# Patient Record
Sex: Male | Born: 1970 | Race: White | Hispanic: No | Marital: Single | Smoking: Current every day smoker
Health system: Southern US, Community
[De-identification: ages and names within clinical notes are randomized; demographics above are authoritative.]

## PROBLEM LIST (undated history)

## (undated) DIAGNOSIS — T7840XA Allergy, unspecified, initial encounter: Secondary | ICD-10-CM

## (undated) DIAGNOSIS — F419 Anxiety disorder, unspecified: Secondary | ICD-10-CM

## (undated) DIAGNOSIS — R011 Cardiac murmur, unspecified: Secondary | ICD-10-CM

## (undated) DIAGNOSIS — Y249XXA Unspecified firearm discharge, undetermined intent, initial encounter: Secondary | ICD-10-CM

## (undated) DIAGNOSIS — B019 Varicella without complication: Secondary | ICD-10-CM

## (undated) DIAGNOSIS — W3400XA Accidental discharge from unspecified firearms or gun, initial encounter: Secondary | ICD-10-CM

## (undated) DIAGNOSIS — F319 Bipolar disorder, unspecified: Secondary | ICD-10-CM

## (undated) DIAGNOSIS — F1910 Other psychoactive substance abuse, uncomplicated: Secondary | ICD-10-CM

## (undated) HISTORY — PX: OTHER SURGICAL HISTORY: SHX169

## (undated) HISTORY — PX: MYRINGOTOMY: SUR874

## (undated) HISTORY — DX: Cardiac murmur, unspecified: R01.1

## (undated) HISTORY — PX: LUNG SURGERY: SHX703

## (undated) HISTORY — PX: GASTROSTOMY W/ FEEDING TUBE: SUR642

## (undated) HISTORY — DX: Anxiety disorder, unspecified: F41.9

## (undated) HISTORY — DX: Allergy, unspecified, initial encounter: T78.40XA

## (undated) HISTORY — DX: Other psychoactive substance abuse, uncomplicated: F19.10

## (undated) HISTORY — PX: FACIAL RECONSTRUCTION SURGERY: SHX631

## (undated) HISTORY — PX: TONSILLECTOMY: SUR1361

---

## 2008-07-06 ENCOUNTER — Emergency Department (HOSPITAL_COMMUNITY): Admission: EM | Admit: 2008-07-06 | Discharge: 2008-07-06 | Payer: Self-pay | Admitting: Emergency Medicine

## 2008-07-28 ENCOUNTER — Emergency Department (HOSPITAL_COMMUNITY): Admission: EM | Admit: 2008-07-28 | Discharge: 2008-07-31 | Payer: Self-pay | Admitting: Emergency Medicine

## 2008-08-07 ENCOUNTER — Emergency Department (HOSPITAL_COMMUNITY): Admission: EM | Admit: 2008-08-07 | Discharge: 2008-08-08 | Payer: Self-pay | Admitting: Emergency Medicine

## 2008-08-19 ENCOUNTER — Emergency Department (HOSPITAL_COMMUNITY): Admission: EM | Admit: 2008-08-19 | Discharge: 2008-08-19 | Payer: Self-pay | Admitting: Emergency Medicine

## 2008-08-23 ENCOUNTER — Emergency Department (HOSPITAL_COMMUNITY): Admission: EM | Admit: 2008-08-23 | Discharge: 2008-08-23 | Payer: Self-pay | Admitting: Emergency Medicine

## 2008-09-03 ENCOUNTER — Emergency Department (HOSPITAL_COMMUNITY): Admission: EM | Admit: 2008-09-03 | Discharge: 2008-09-04 | Payer: Self-pay | Admitting: Emergency Medicine

## 2011-09-05 LAB — BASIC METABOLIC PANEL
Calcium: 9.3
GFR calc Af Amer: >60
GFR calc non Af Amer: >60
Glucose, Bld: 114 — ABNORMAL HIGH
Sodium: 136

## 2011-09-05 LAB — CBC
Hemoglobin: 14.7
RBC: 4.85
RDW: 12.8
WBC: 16 — ABNORMAL HIGH

## 2011-09-05 LAB — DIFFERENTIAL
Basophils Absolute: 0
Lymphocytes Relative: 17
Lymphs Abs: 2.7
Monocytes Absolute: 1
Neutro Abs: 12.1 — ABNORMAL HIGH

## 2011-09-05 LAB — ETHANOL: Alcohol, Ethyl (B): <5

## 2011-09-05 LAB — RAPID URINE DRUG SCREEN, HOSP PERFORMED
Benzodiazepines: NOT DETECTED
Cocaine: NOT DETECTED

## 2011-09-08 LAB — DIFFERENTIAL
Lymphocytes Relative: 33
Lymphs Abs: 3.2
Monocytes Absolute: 0.8
Monocytes Relative: 8
Neutro Abs: 5.5

## 2011-09-08 LAB — BASIC METABOLIC PANEL
CO2: 30
Calcium: 8.5
GFR calc Af Amer: >60
GFR calc non Af Amer: >60
Sodium: 139

## 2011-09-08 LAB — RAPID URINE DRUG SCREEN, HOSP PERFORMED
Amphetamines: NOT DETECTED
Barbiturates: NOT DETECTED
Tetrahydrocannabinol: POSITIVE — AB

## 2011-09-08 LAB — CBC
Hemoglobin: 12.7 — ABNORMAL LOW
RBC: 4.11 — ABNORMAL LOW

## 2011-09-10 LAB — RAPID URINE DRUG SCREEN, HOSP PERFORMED
Amphetamines: NOT DETECTED
Barbiturates: NOT DETECTED
Benzodiazepines: NOT DETECTED
Opiates: NOT DETECTED

## 2011-09-10 LAB — DIFFERENTIAL
Basophils Absolute: 0
Basophils Relative: 1
Eosinophils Absolute: 0.3
Eosinophils Relative: 4
Monocytes Absolute: 0.7
Neutro Abs: 6

## 2011-09-10 LAB — CBC
Hemoglobin: 11.1 — ABNORMAL LOW
MCHC: 33.9
MCV: 91.2
RDW: 13.5

## 2011-09-10 LAB — BASIC METABOLIC PANEL
CO2: 29
Calcium: 8.4
Chloride: 107
Glucose, Bld: 98
Sodium: 141

## 2011-09-10 LAB — URINALYSIS, ROUTINE W REFLEX MICROSCOPIC
Bilirubin Urine: NEGATIVE
Hgb urine dipstick: NEGATIVE
Protein, ur: NEGATIVE
Urobilinogen, UA: 0.2

## 2011-10-23 ENCOUNTER — Emergency Department (HOSPITAL_COMMUNITY)
Admission: EM | Admit: 2011-10-23 | Discharge: 2011-10-24 | Disposition: A | Discharge status: Other Acute Inpatient Hospital | Payer: Medicare Other | Attending: Emergency Medicine | Admitting: Emergency Medicine

## 2011-10-23 ENCOUNTER — Encounter: Payer: Self-pay | Admitting: *Deleted

## 2011-10-23 DIAGNOSIS — Z79899 Other long term (current) drug therapy: Secondary | ICD-10-CM | POA: Insufficient documentation

## 2011-10-23 DIAGNOSIS — F17200 Nicotine dependence, unspecified, uncomplicated: Secondary | ICD-10-CM | POA: Insufficient documentation

## 2011-10-23 DIAGNOSIS — F259 Schizoaffective disorder, unspecified: Secondary | ICD-10-CM | POA: Insufficient documentation

## 2011-10-23 DIAGNOSIS — F319 Bipolar disorder, unspecified: Secondary | ICD-10-CM

## 2011-10-23 HISTORY — DX: Bipolar disorder, unspecified: F31.9

## 2011-10-23 LAB — DIFFERENTIAL
Basophils Absolute: 0.1 10*3/uL (ref 0.0–0.1)
Basophils Relative: 1 % (ref 0–1)
Eosinophils Absolute: 0.3 10*3/uL (ref 0.0–0.7)
Neutro Abs: 4.5 10*3/uL (ref 1.7–7.7)
Neutrophils Relative %: 57 % (ref 43–77)

## 2011-10-23 LAB — RAPID URINE DRUG SCREEN, HOSP PERFORMED
Barbiturates: NOT DETECTED
Benzodiazepines: NOT DETECTED
Cocaine: NOT DETECTED

## 2011-10-23 LAB — CBC
Hemoglobin: 13.7 g/dL (ref 13.0–17.0)
MCH: 31.8 pg (ref 26.0–34.0)
MCHC: 34.9 g/dL (ref 30.0–36.0)
Platelets: 344 10*3/uL (ref 150–400)
RDW: 13.9 % (ref 11.5–15.5)

## 2011-10-23 LAB — ETHANOL: Alcohol, Ethyl (B): <11 mg/dL (ref 0–11)

## 2011-10-23 LAB — BASIC METABOLIC PANEL
Chloride: 100 mEq/L (ref 96–112)
GFR calc Af Amer: >90 mL/min (ref >90)
GFR calc non Af Amer: >90 mL/min (ref >90)
Potassium: 3.4 mEq/L — ABNORMAL LOW (ref 3.5–5.1)
Sodium: 138 mEq/L (ref 135–145)

## 2011-10-23 MED ORDER — NICOTINE 21 MG/24HR TD PT24
21.0000 mg | MEDICATED_PATCH | Freq: Once | TRANSDERMAL | Status: AC
  Administered 2011-10-23: 21 mg via TRANSDERMAL
  Filled 2011-10-23 (×2): qty 1

## 2011-10-23 MED ORDER — LORAZEPAM 1 MG PO TABS
ORAL_TABLET | ORAL | Status: AC
  Administered 2011-10-23: 1 mg
  Filled 2011-10-23: qty 1
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
## 2011-10-23 NOTE — ED Notes (Signed)
Pt requesting to use phone - informed pt not at this time due to uncooperative behavior and aggression.  nad noted, [employer] at bedside.
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
## 2011-10-23 NOTE — ED Notes (Signed)
Lunch tray given.  nad noted.
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
## 2011-10-24 ENCOUNTER — Inpatient Hospital Stay (HOSPITAL_COMMUNITY)
Admission: AD | Admit: 2011-10-24 | Discharge: 2011-10-29 | DRG: 885 | Disposition: A | Discharge status: Home / Self Care | Payer: 59 | Attending: Psychiatry | Admitting: Psychiatry

## 2011-10-24 DIAGNOSIS — Z79899 Other long term (current) drug therapy: Secondary | ICD-10-CM

## 2011-10-24 DIAGNOSIS — F319 Bipolar disorder, unspecified: Secondary | ICD-10-CM

## 2011-10-24 DIAGNOSIS — F17200 Nicotine dependence, unspecified, uncomplicated: Secondary | ICD-10-CM

## 2011-10-24 DIAGNOSIS — F3160 Bipolar disorder, current episode mixed, unspecified: Principal | ICD-10-CM

## 2011-10-24 DIAGNOSIS — F29 Unspecified psychosis not due to a substance or known physiological condition: Secondary | ICD-10-CM

## 2011-10-24 DIAGNOSIS — F1910 Other psychoactive substance abuse, uncomplicated: Secondary | ICD-10-CM

## 2011-10-24 DIAGNOSIS — F1994 Other psychoactive substance use, unspecified with psychoactive substance-induced mood disorder: Secondary | ICD-10-CM

## 2011-10-24 DIAGNOSIS — Z880 Allergy status to penicillin: Secondary | ICD-10-CM

## 2011-10-24 DIAGNOSIS — F250 Schizoaffective disorder, bipolar type: Secondary | ICD-10-CM | POA: Diagnosis present

## 2011-10-24 MED ORDER — MAGNESIUM HYDROXIDE 400 MG/5ML PO SUSP: 30.0000 mL | Freq: Every day | ORAL | Status: DC | PRN

## 2011-10-24 MED ORDER — ACETAMINOPHEN 325 MG PO TABS
650.0000 mg | ORAL_TABLET | Freq: Four times a day (QID) | ORAL | Status: DC | PRN
  Administered 2011-10-25: 650 mg via ORAL

## 2011-10-24 MED ORDER — RISPERIDONE 0.5 MG PO TABS: 0.5000 mg | ORAL_TABLET | Freq: Four times a day (QID) | ORAL | Status: DC | PRN

## 2011-10-24 MED ORDER — RISPERIDONE 0.5 MG PO TABS: 0.5000 mg | ORAL_TABLET | Freq: Two times a day (BID) | ORAL | Status: DC

## 2011-10-24 MED ORDER — CLONAZEPAM 0.5 MG PO TABS: 0.5000 mg | ORAL_TABLET | Freq: Every day | ORAL | Status: DC

## 2011-10-24 MED ORDER — DIAZEPAM 5 MG PO TABS
ORAL_TABLET | ORAL | Status: AC
  Filled 2011-10-24: qty 2

## 2011-10-24 MED ORDER — ALUM & MAG HYDROXIDE-SIMETH 200-200-20 MG/5ML PO SUSP: 30.0000 mL | ORAL | Status: DC | PRN

## 2011-10-24 MED ORDER — TRAZODONE HCL 100 MG PO TABS
200.0000 mg | ORAL_TABLET | Freq: Every evening | ORAL | Status: DC | PRN
  Administered 2011-10-24 – 2011-10-27 (×2): 200 mg via ORAL
  Filled 2011-10-24: qty 2
  Filled 2011-10-24: qty 14
  Filled 2011-10-24 (×2): qty 2

## 2011-10-24 MED ORDER — DIAZEPAM 5 MG PO TABS
10.0000 mg | ORAL_TABLET | Freq: Once | ORAL | Status: AC
  Administered 2011-10-24: 10 mg via ORAL

## 2011-10-24 MED ORDER — BENZTROPINE MESYLATE 2 MG PO TABS: 4.0000 mg | ORAL_TABLET | Freq: Once | ORAL | Status: DC

## 2011-10-24 MED ORDER — NICOTINE 21 MG/24HR TD PT24
21.0000 mg | MEDICATED_PATCH | Freq: Every day | TRANSDERMAL | Status: DC
  Administered 2011-10-25: 21 mg via TRANSDERMAL
  Filled 2011-10-24 (×2): qty 1

## 2011-10-24 MED ORDER — DIAZEPAM 5 MG PO TABS: 10.0000 mg | ORAL_TABLET | Freq: Once | ORAL | Status: DC

## 2011-10-24 NOTE — ED Notes (Signed)
Patient accepted at behavioral health for psychosis.  Awake, alert, vitals stable.  No complaints.  Glynn Octave, MD 10/24/11 1556

## 2011-10-24 NOTE — ED Notes (Signed)
Patient wants another shower where he has been working out and has already had one today. RN Kendal Hymen stated that patient would have to wait till a later time since he has already had one this morning.

## 2011-10-24 NOTE — ED Notes (Signed)
[employer] called for transport to [facility]. They will send an officer when one is available.

## 2011-10-24 NOTE — ED Notes (Signed)
Sheriff here to transport patient to [place] for transport to [facility].

## 2011-10-24 NOTE — ED Notes (Signed)
Rosey Bath from [facility] called [facility] that the patient had been denied by the doctor there due to chronic behavior problems.

## 2011-10-24 NOTE — ED Notes (Signed)
Pt calm and cooperative throughout the night and this am. Consulting civil engineer to sign of on law enforcement at bedside. Officer and Rn at bedside to speak with pt. nad noted.

## 2011-10-24 NOTE — ED Notes (Signed)
Patient standing in doorway of room, stating he needs his medications.  Patient informed that he doesn't have any medications ordered for tonight.

## 2011-10-25 DIAGNOSIS — F250 Schizoaffective disorder, bipolar type: Secondary | ICD-10-CM | POA: Diagnosis present

## 2011-10-25 LAB — DIFFERENTIAL
Basophils Absolute: 0.1 10*3/uL (ref 0.0–0.1)
Basophils Relative: 1 % (ref 0–1)
Eosinophils Absolute: 0.3 10*3/uL (ref 0.0–0.7)
Eosinophils Relative: 4 % (ref 0–5)
Lymphs Abs: 2.9 10*3/uL (ref 0.7–4.0)

## 2011-10-25 LAB — CBC
MCH: 31 pg (ref 26.0–34.0)
MCHC: 33 g/dL (ref 30.0–36.0)
MCV: 93.7 fL (ref 78.0–100.0)
Platelets: 334 10*3/uL (ref 150–400)
RDW: 14.1 % (ref 11.5–15.5)

## 2011-10-25 MED ORDER — MAGNESIUM HYDROXIDE 400 MG/5ML PO SUSP: 30.0000 mL | Freq: Every day | ORAL | Status: DC | PRN

## 2011-10-25 MED ORDER — ACETAMINOPHEN 325 MG PO TABS: 650.0000 mg | ORAL_TABLET | Freq: Four times a day (QID) | ORAL | Status: DC | PRN

## 2011-10-25 MED ORDER — NICOTINE 21 MG/24HR TD PT24
21.0000 mg | MEDICATED_PATCH | Freq: Once | TRANSDERMAL | Status: AC
  Administered 2011-10-26: 21 mg via TRANSDERMAL
  Filled 2011-10-25: qty 1

## 2011-10-25 MED ORDER — ALUM & MAG HYDROXIDE-SIMETH 200-200-20 MG/5ML PO SUSP: 30.0000 mL | ORAL | Status: DC | PRN

## 2011-10-25 MED ORDER — HALOPERIDOL LACTATE 5 MG/ML IJ SOLN: 5.0000 mg | Freq: Four times a day (QID) | INTRAMUSCULAR | Status: DC | PRN

## 2011-10-25 MED ORDER — LORAZEPAM 1 MG PO TABS
2.0000 mg | ORAL_TABLET | Freq: Four times a day (QID) | ORAL | Status: DC | PRN
  Administered 2011-10-25 – 2011-10-29 (×10): 2 mg via ORAL
  Filled 2011-10-25 (×10): qty 2

## 2011-10-25 MED ORDER — HALOPERIDOL 5 MG PO TABS
5.0000 mg | ORAL_TABLET | Freq: Four times a day (QID) | ORAL | Status: DC | PRN
  Administered 2011-10-25 – 2011-10-27 (×7): 5 mg via ORAL
  Filled 2011-10-25 (×7): qty 1
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
## 2011-10-25 NOTE — Progress Notes (Signed)
Psychiatric Admission Assessment Adult  Patient Identification:  Blake Hardin Date of Evaluation:  10/25/2011 Chief Complaint:  Psychotic D/O  History of Present Illness:This 40yo DWM was making homemade cocaine by cooking lidocaine and pancake batter.His brother felt that he needed "help" and since he will do whatever his brother asks - he went to [facility]. He was put on IVC due to mania and psychosis. His UDS was neg and he had no measurable alcohol not sure what triggered his escalation yesterday.  Mood Symptoms:  Hypomania/Mania Depression Symptoms:  psychomotor agitation (Hypo) Manic Symptoms:   Elevated Mood:  Yes Irritable Mood:  No Grandiosity:  Yes Distractibility:  Yes Labiality of Mood:  Yes Delusions:  No Hallucinations:  No Impulsivity:  Yes Sexually Inappropriate Behavior:  No Financial Extravagance:  No Flight of Ideas:  No  Anxiety Symptoms: Excessive Worry:  No Panic Symptoms:  No Agoraphobia:  No Obsessive Compulsive: No  Symptoms: None Specific Phobias:  No Social Anxiety:  No  Psychotic Symptoms:  Hallucinations:  None Delusions:  No Paranoia:  No   Ideas of Reference:  No  PTSD Symptoms: Ever had a traumatic exposure:  No Had a traumatic exposure in the last month:  No Re-experiencing:  None Hypervigilance:  No Hyperarousal:  None Avoidance:  None  Traumatic Brain Injury:  No history   Past Psychiatric History: 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 Bipolar 1 disorder    History of Loss of Consciousness:  No Seizure History:  No Cardiac History:  No Allergies:   Allergies  Allergen Reactions  . Penicillins Other (See Comments)    Reaction: unknown  Childhood allergy   Current Medications:  Current Facility-Administered Medications  Medication Dose Route Frequency Provider Last Rate Last Dose  . acetaminophen (TYLENOL) tablet 650 mg  650 mg Oral Q6H PRN Franchot Gallo, MD   650 mg at 10/25/11 1140  . alum & mag hydroxide-simeth (MAALOX/MYLANTA) 200-200-20 MG/5ML suspension 30 mL  30 mL Oral Q4H PRN Viviann Spare, NP      . haloperidol (HALDOL) tablet 5 mg  5 mg Oral Q6H PRN Viviann Spare, NP      . haloperidol lactate (HALDOL) injection 5 mg  5 mg Intramuscular Q6H PRN Viviann Spare, NP      . LORazepam (ATIVAN) tablet 2 mg  2 mg Oral Q6H PRN Viviann Spare, NP      . magnesium hydroxide (MILK OF MAGNESIA) suspension 30 mL  30 mL Oral Daily PRN Viviann Spare, NP      . nicotine (NICODERM CQ - dosed in mg/24 hours) patch 21 mg  21 mg Transdermal Once Viviann Spare, NP      . traZODone (DESYREL) tablet 200 mg  200 mg Oral QHS PRN Franchot Gallo, MD   200 mg at 10/24/11 2343  . DISCONTD: acetaminophen (TYLENOL) tablet 650 mg  650 mg Oral Q6H PRN Viviann Spare, NP      . DISCONTD: alum & mag hydroxide-simeth (MAALOX/MYLANTA) 200-200-20 MG/5ML suspension 30 mL  30 mL Oral Q4H PRN Franchot Gallo, MD      . DISCONTD: magnesium hydroxide (MILK OF MAGNESIA) suspension 30 mL  30 mL Oral Daily PRN Franchot Gallo, MD      .  DISCONTD: nicotine (NICODERM CQ - dosed in mg/24 hours) patch 21 mg  21 mg Transdermal Q0600 Franchot Gallo, MD   21 mg at 10/25/11 0621   Facility-Administered Medications Ordered in Other Encounters  Medication Dose Route Frequency Provider Last Rate Last Dose  . nicotine (NICODERM CQ - dosed in mg/24 hours) patch 21 mg  21 mg Transdermal Once [employer]. Colon Branch, MD   21 mg at 10/23/11 1638  . DISCONTD: benztropine (COGENTIN) tablet 4 mg  4 mg Oral Once Glynn Octave, MD      . DISCONTD: diazepam (VALIUM) tablet 10 mg  10 mg Oral Once Glynn Octave, MD        Previous Psychotropic  Medications:  Medication Dose  Haldol Deconoate 50 mg  Last weeka  Cogentin 2 mg  BID  Trazadone                 Substance Abuse History in the last 12 months: Substance Age of 1st Use Last Use Amount Specific Type  Nicotine      Alcohol  Beer occ 1-3     Cannabis      Opiates      Cocaine      Methamphetamines      LSD  In past     Ecstasy      Benzodiazepines      Caffeine      Inhalants      Others:                         Medical Consequences of Substance Abuse:His current psychiatric condition   Legal Consequences of Substance Abuse:Denies   Family Consequences of Substance Abuse:is divorced   Blackouts:  No DT's:  No Withdrawal Symptoms:  None  Social History: Current Place of Residence:   Place of Birth:   Family Members: Marital Status:  Divorced Children:  Sons:  Daughters: 2 ages 29 & 15  Relationships: 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  Mental Status Examination/Evaluation: Objective:  Appearance: Fairly Groomed  Eye Contact::  Good  Speech:  Clear and Coherent  Volume:  Normal  Mood: calm today    Affect:  Full Range  Thought Process:  Linear  Orientation:  Full  Thought Content:  Clear   Suicidal Thoughts:  No  Homicidal Thoughts:  No  Judgement:  Other:  varies-is distractible   Insight:  Shallow  Psychomotor Activity:  Increased  Akathisia:  No  Handed:  Right  AIMS (if indicated):     Assets:  Architect Housing Resilience Social Support    Laboratory/X-Ray Psychological Evaluation(s)      Assessment:  Axis I: Schizoaffective Disorder  AXIS I Substance Induced Mood Disorder  AXIS II Deferred  AXIS III Past Medical History   Diagnosis Date  . Bipolar 1 disorder      AXIS IV None known   AXIS V 31-40 impairment in reality testing   Treatment Plan/Recommendations:  Treatment Plan Summary: Daily contact with patient to assess and evaluate symptoms and progress in treatment Medication management  Observation Level/Precautions:  C.O.  Laboratory:    Psychotherapy:Group     Medications: continue home meds    Routine PRN Medications:  Yes  Consultations:  None   Discharge Concerns: None  Other:      Kylia Grajales,MICKIE D. 11/17/20123:25 PM

## 2011-10-25 NOTE — H&P (Signed)
Blake Hardin is an 40 y.o. male.   Chief Complaint: Manic & Psychotic HPI: 40 yo DWM. Lives with his brother who told him to get "help". Patient was trying to make homemade cocaine by mixing lidocaine and pancake batter. According to his brother his mental status was changed years ago from LSD.   Patient tells me that he his symptoms appeared after the DaVinci Code and the Omen came out. Has been receiving SSDI for 2-3 years. Went ot [facility] where he was put on IVC and eventually was admitted here to [facility]. He is probably at his baseline now.    Past Medical History  Diagnosis Date  . Bipolar 1 disorder     Past Surgical History  Procedure Date  . Lung surgery   . Tonsillectomy   . Myringotomy     No family history on file. Social History:  reports that he has been smoking Cigarettes.  He does not have any smokeless tobacco history on file. He reports that he drinks alcohol. He reports that he does not use illicit drugs.  Allergies:  Allergies  Allergen Reactions  . Penicillins Other (See Comments)    Reaction: unknown Childhood allergy    Medications Prior to Admission  Medication Dose Route Frequency Provider Last Rate Last Dose  . acetaminophen (TYLENOL) tablet 650 mg  650 mg Oral Q6H PRN Franchot Gallo, MD   650 mg at 10/25/11 1140  . alum & mag hydroxide-simeth (MAALOX/MYLANTA) 200-200-20 MG/5ML suspension 30 mL  30 mL Oral Q4H PRN Viviann Spare, NP      . diazepam (VALIUM) tablet 10 mg  10 mg Oral Once Geoffery Lyons, MD   10 mg at 10/24/11 1152  . haloperidol (HALDOL) tablet 5 mg  5 mg Oral Q6H PRN Viviann Spare, NP      . haloperidol lactate (HALDOL) injection 5 mg  5 mg Intramuscular Q6H PRN Viviann Spare, NP      . LORazepam (ATIVAN) 1 MG tablet        1 mg at 10/23/11 1600  . LORazepam (ATIVAN) tablet 2 mg  2 mg Oral Q6H PRN Viviann Spare, NP      . magnesium hydroxide (MILK OF MAGNESIA) suspension 30 mL  30 mL Oral Daily PRN Viviann Spare, NP      . nicotine (NICODERM CQ - dosed in mg/24 hours) patch 21 mg  21 mg Transdermal Once [employer]. Colon Branch, MD   21 mg at 10/23/11 1638  . nicotine (NICODERM CQ - dosed in mg/24 hours) patch 21 mg  21 mg Transdermal Once Viviann Spare, NP      . traZODone (DESYREL) tablet 200 mg  200 mg Oral QHS PRN Franchot Gallo, MD   200 mg at 10/24/11 2343  . DISCONTD: acetaminophen (TYLENOL) tablet 650 mg  650 mg Oral Q6H PRN Viviann Spare, NP      . DISCONTD: alum & mag hydroxide-simeth (MAALOX/MYLANTA) 200-200-20 MG/5ML suspension 30 mL  30 mL Oral Q4H PRN Franchot Gallo, MD      . DISCONTD: benztropine (COGENTIN) tablet 4 mg  4 mg Oral Once Glynn Octave, MD      . DISCONTD: clonazePAM (KLONOPIN) tablet 0.5 mg  0.5 mg Oral QHS Viviann Spare, NP      . DISCONTD: diazepam (VALIUM) tablet 10 mg  10 mg Oral Once Glynn Octave, MD      . DISCONTD: magnesium hydroxide (MILK OF MAGNESIA) suspension 30 mL  30 mL Oral Daily PRN Franchot Gallo, MD      . DISCONTD: nicotine (NICODERM CQ - dosed in mg/24 hours) patch 21 mg  21 mg Transdermal Q0600 Franchot Gallo, MD   21 mg at 10/25/11 1610  . DISCONTD: risperiDONE (RISPERDAL) tablet 0.5 mg  0.5 mg Oral BID Viviann Spare, NP      . DISCONTD: risperiDONE (RISPERDAL) tablet 0.5 mg  0.5 mg Oral Q6H PRN Viviann Spare, NP       Medications Prior to Admission  Medication Sig Dispense Refill  . benztropine (COGENTIN) 2 MG tablet Take 4 mg by mouth 2 (two) times daily.        . haloperidol decanoate (HALDOL DECANOATE) 100 MG/ML injection Inject 50 mg into the muscle every 28 (twenty-eight) days.        . Multiple Vitamins-Minerals (MULTIVITAMINS THER. W/MINERALS) TABS Take 1 tablet by mouth daily.        . traZODone (DESYREL) 100 MG tablet Take 200 mg by mouth at bedtime.         Results for orders placed during the hospital encounter of 10/24/11 (from the past 48 hour(s))  CBC     Status: Normal   Collection Time   10/25/11  6:45 AM       Component Value Range Comment   WBC 8.6  4.0 - 10.5 (K/uL)    RBC 4.62  4.22 - 5.81 (MIL/uL)    Hemoglobin 14.3  13.0 - 17.0 (g/dL)    HCT 96.0  45.4 - 09.8 (%)    MCV 93.7  78.0 - 100.0 (fL)    MCH 31.0  26.0 - 34.0 (pg)    MCHC 33.0  30.0 - 36.0 (g/dL)    RDW 11.9  14.7 - 82.9 (%)    Platelets 334  150 - 400 (K/uL)   DIFFERENTIAL     Status: Normal   Collection Time   10/25/11  6:45 AM      Component Value Range Comment   Neutrophils Relative 54  43 - 77 (%)    Neutro Abs 4.6  1.7 - 7.7 (K/uL)    Lymphocytes Relative 34  12 - 46 (%)    Lymphs Abs 2.9  0.7 - 4.0 (K/uL)    Monocytes Relative 8  3 - 12 (%)    Monocytes Absolute 0.7  0.1 - 1.0 (K/uL)    Eosinophils Relative 4  0 - 5 (%)    Eosinophils Absolute 0.3  0.0 - 0.7 (K/uL)    Basophils Relative 1  0 - 1 (%)    Basophils Absolute 0.1  0.0 - 0.1 (K/uL)    No results found.  Review of Systems  Constitutional: Negative.   HENT: Negative.   Eyes: Negative.   Respiratory: Negative.   Cardiovascular: Negative.   Gastrointestinal: Negative.   Musculoskeletal: Negative.   Skin: Negative.        tattoos   Neurological: Negative.   Endo/Heme/Allergies: Negative.   Psychiatric/Behavioral: The patient is nervous/anxious.     Blood pressure 117/77, pulse 101, temperature 97.4 F (36.3 C), temperature source Oral, resp. rate 18, height 6' (1.829 m), weight 59.875 kg (132 lb), SpO2 99.00%. Physical Exam  Constitutional: He is oriented to person, place, and time. He appears well-developed and well-nourished.  HENT:  Head: Normocephalic.  Eyes: Pupils are equal, round, and reactive to light.  Neck: Normal range of motion.  Cardiovascular: Regular rhythm.   Respiratory: Effort normal.  GI: Soft.  Musculoskeletal:  Normal range of motion.  Neurological: He is alert and oriented to person, place, and time.  Skin: Skin is warm and dry.  Psychiatric:       Was manic and psychotic yesteday. Was making home made cocaine mixing  lidocaine and pancake batter.      Assessment/Plan Admit for safety stabilization and adjust meds a sindicated Can return to prehospital care    Reyanna Baley,MICKIE D. 10/25/2011, 3:15 PM

## 2011-10-25 NOTE — Progress Notes (Signed)
Pt. attended and participated in aftercare planning group. Suicide prevention information was given, including warning signs to look for with suicide and crisis line numbers to use. Pt. Stated that he wanted his brother to commit him to [facility]. Pt. Stated that he likes it at [facility] and stated "This is what rich white people do". Pt. Stated that he would like to go to church tomorrow.

## 2011-10-25 NOTE — Progress Notes (Signed)
  Pt.is still holding his Bible and talking about his worldly belongings.He is med compliant and attending Groups.Is pleasently  Disorganized.He denies voices but seems to answer someone Encouraged and supported.

## 2011-10-25 NOTE — Progress Notes (Signed)
   [facility] Group Notes:  (Counselor/Nursing/MHT/Case Management/Adjunct)  10/25/2011 11 AM  Type of Therapy:  Group Therapy, Dance/Movement Therapy   Participation Level:  Minimal  Participation Quality:  Sharing  Affect:  Anxious, Excited and Irritable  Cognitive:  Alert and Oriented  Insight:  Limited  Engagement in Group:  Limited  Engagement in Therapy:  Limited  Modes of Intervention:  Clarification, Problem-solving, Role-play, Socialization and Support  Summary of Progress/Problems:  Pt. Said that he feels blessed today.  Thomasena Edis, [employer]

## 2011-10-25 NOTE — Progress Notes (Signed)
[facility] Adult Inpatient Family/Significant Other Suicide Prevention Education  Suicide Prevention Education:  Contact Attempts: Blake Hardin. [phone] (brother) has been identified by the patient as the family member/significant other with whom the patient will be residing, and identified as the person(s) who will aid the patient in the event of a mental health crisis.  With written consent from the patient, two attempts were made to provide suicide prevention education, prior to and/or following the patient's discharge.  We were unsuccessful in providing suicide prevention education.  A suicide education pamphlet was given to the patient to share with family/significant other.  Date and time of first attempt: by Blake Hardin  -left message on 10/25/11 at 4:30 p.m. Date and time of second attempt  Blake Hardin 10/25/2011, 5:39 PM

## 2011-10-25 NOTE — Progress Notes (Signed)
H &P was reviewed and I agree with key elements and treatment plan. Please see H& P dated 10/25/2011.

## 2011-10-26 DIAGNOSIS — F259 Schizoaffective disorder, unspecified: Secondary | ICD-10-CM

## 2011-10-26 DIAGNOSIS — F39 Unspecified mood [affective] disorder: Secondary | ICD-10-CM

## 2011-10-26 NOTE — Progress Notes (Signed)
[facility] Adult Inpatient Family/Significant Other Suicide Prevention Education  Suicide Prevention Education:  Education Completed; Deirdre Peer. [phone]( pt.'s brother ) has been identified by the patient as the family member/significant other with whom the patient will be residing, and identified as the person(s) who will aid the patient in the event of a mental health crisis (suicidal ideations/suicide attempt).  With written consent from the patient, the family member/significant other has been provided the following suicide prevention education, prior to the and/or following the discharge of the patient.  The suicide prevention education provided includes the following:  Suicide risk factors  Suicide prevention and interventions  National Suicide Hotline telephone number  [facility] assessment telephone number  [facility] Emergency Assistance 911  [facility] and/or Residential Mobile Crisis Unit telephone number  Request made of family/significant other to:  Remove weapons (e.g., guns, rifles, knives), all items previously/currently identified as safety concern. Pt.'s brother states he thinks that the pt. has a pocket knife  And he will remove from the pt.'s belonging before the pt. Is discharged. The pt. cannot return to live with his brother Windy Fast, where had stayed only one day. The pt. does not know this.  Remove drugs/medications (over-the-counter, prescriptions, illicit drugs), all items previously/currently identified as a safety concern.  Pt.'s brother is unsure of any illegal drugs the pt. Was taking, but states the pt. has used illegal street drugs with his prescription drugs for Schizoaffective disorder.  Pt.'s brother reports that the pt.'s mother always took care of the pt. and dealt with is mental health issues, but she is now deceased. Windy Fast is unable to care for pt. and pt.'s sister is currently living with Windy Fast and states that the pt. and his  sister cannot be near each other. Windy Fast states that the pt. has had a history of SI and HI and that the pt.'s sister who currently stays with him is in the hospital today for a drug overdose and that he has just come from the hospital. Pt. Is homeless and needs help with finding housing. Pt. was taken to [facility] by his brother Windy Fast and  [facility] involuntary committed the t, took him to [facility] and pt. Was brought to [facility].   The family member/significant other verbalizes understanding of the suicide prevention education information provided.  The family member/significant other agrees to remove the items of safety concern listed above.  Lamar Blinks [place] 10/26/2011, 12:13 PM

## 2011-10-26 NOTE — Progress Notes (Signed)
Interval History:  Feeling better today in terms his mood. Able to sleep well. Thinks he is close to his baseline and wants to go home soon. Made friends on the unit.  Mental Status Examination/Evaluation: Objective:  Appearance: Fairly Groomed  Eye Contact::  Good  Speech:  Clear and Coherent  Volume:  Normal  Mood: calm today    Affect:  Full Range  Thought Process:  Linear  Orientation:  Full  Thought Content:  Clear   Suicidal Thoughts:  No  Homicidal Thoughts:  No  Judgement:  limited  Insight:  Shallow  Psychomotor Activity:  normal  Akathisia:  No                 Assessment:  Axis I: Schizoaffective Disorder  AXIS I Substance Induced Mood Disorder  AXIS II Deferred            Treatment Plan/Recommendations:  Continue current meds

## 2011-10-26 NOTE — Progress Notes (Signed)
Pt awoke at 0230 asking for meds. Denies any problems or complaints until he is told nothing is ordered at this time. When he asked about his haldol and ativan this RN explained to pt that those would be appropriate if he were agitated, which he had denied just moments earlier. Pt stated, "well I'm going to get agitated if you don't give them to me." Pt would not comply with any further attempts at assessment. "I'm sick of being asked those questions." Pt took meds and returned to bed where he continues to rest. Edsel Petrin RN

## 2011-10-26 NOTE — Progress Notes (Signed)
Pt. attended and participated in aftercare planning group. [facility] support group information was given as well as information on suicide prevention information, warning signs to look for with suicide and crisis line numbers to use. 

## 2011-10-26 NOTE — Progress Notes (Signed)
Pt up intermittently during the day today, able to participate in milieu activities to the best of his ability at this time, pt has been irritable and aloof today, has requested medications for agitation and anxiety today and has spoken about leaving in the morning, pt has denied any auditory hallucinations today, support provided, will continue to monitor.

## 2011-10-27 DIAGNOSIS — F29 Unspecified psychosis not due to a substance or known physiological condition: Secondary | ICD-10-CM

## 2011-10-27 MED ORDER — NICOTINE 21 MG/24HR TD PT24
21.0000 mg | MEDICATED_PATCH | Freq: Once | TRANSDERMAL | Status: AC
  Administered 2011-10-27 – 2011-10-29 (×2): 21 mg via TRANSDERMAL
  Filled 2011-10-27: qty 1

## 2011-10-27 MED ORDER — HALOPERIDOL 5 MG PO TABS
5.0000 mg | ORAL_TABLET | Freq: Every day | ORAL | Status: DC
  Administered 2011-10-27: 5 mg via ORAL
  Filled 2011-10-27 (×2): qty 1

## 2011-10-27 NOTE — Progress Notes (Signed)
Nursing: Pt. Has been in bed all evening and has not arisen for any purpose.  No HS meds were required and had remained asleep, respirations even and regular. 02:00--Pt. Awoke and came to nursing station asking for "my medication".  No scheduled meds were missed-explained same to Pt.: gave Pt. Haldol only--told Pt. Ativan would be given if necessary later. Pt. accepted the Haldol and went back to bed.

## 2011-10-27 NOTE — Tx Team (Signed)
Interdisciplinary Treatment Plan Update (Adult)  Date:  10/27/2011  Time Reviewed:  9:55 AM   Progress in Treatment: Attending groups: Yes. and As evidenced by:  documented in chart Participating in groups:  Yes. and As evidenced by:  fully engaged Taking medication as prescribed:  Yes. and As evidenced by:  no refusals have been documented Tolerating medication:  Yes. and As evidenced by:  no reported or noted side effects Family/Significant othe contact made:  Yes, individual(s) contacted:  brother, needs to be contacted again Patient understands diagnosis:  Yes. and As evidenced by:  able to talk about his diagnosis of schizoaffective disorder, history of substance abuse, and his medications Discussing patient identified problems/goals with staff:  Yes. and As evidenced by:  fully disclosing Medical problems stabilized or resolved:  Yes. Denies suicidal/homicidal ideation: Yes. Issues/concerns per patient self-inventory:  No. Other:  New problem(s) identified: Yes, Describe:  patient just informed during Treatment Team that his brother has stated that he cannot return there, was there only 1 day, and will need help with finding housing  Reason for Continuation of Hospitalization: Aggression Anxiety Delusions  Depression Medication stabilization Irritability Poor judgment Collateral information needed  Interventions implemented related to continuation of hospitalization:  Medication monitoring and adjustment, safety checks Q15 min., group therapy, psychoeducation, collateral contact  Additional comments: Patient talks about being in [place], [facility], [facility], and also [facility] 7 times.  Estimated length of stay:  2-3 days  Discharge Plan:  Return to live with brother and his wife (Patient's plan, but they state he cannot return there), and then follow up with [facility]  New goal(s):  Not applicable  Review of initial/current patient goals per problem list:   1.   Goal(s):  Reduce psychotic symptoms to baseline  Met:  No  Target date:  By Discharge   As evidenced by:  Remains delusional "I own the house so he can't kick me out, a limo could pick me about right now"  When asked if he hears voices, he states he does not know what other people hear or see out of their heads, so he cannot answer question.  2.  Goal (s):  Ascertain if there is current SA, decide how/if to address  Met:  No  Target date:  By Discharge   As evidenced by:  Patient states there is no SA going on right now, but then does admit to drinking 3 beers recently in one day, was mixing Lidocaine with pancake mix to make a "cheap cocaine"  3.  Goal(s):  Reduce mania to baseline  Met:  No  Target date:  By Discharge   As evidenced by:  Still displays some mania  4.  Goal(s):  Reduce suicidal ideations / risk of suicide or asking someone to kill him at D/C  Met:  Yes  Target date:  By Discharge   As evidenced by:  States is not suicidal, never was  Attendees: Patient:  Blake Hardin  10/27/2011  9:55 AM   Family:     Physician:  Dr. Gwenyth Bouillon Ahluwalia 10/27/2011  9:55 AM   Nursing:   Kenard Gower, RN 10/27/2011  9:55 AM   Case Manager:  Ambrose Mantle, LCSW 10/27/2011  9:55 AM   Counselor:  [name], MT-BC 10/27/2011  9:55 AM   Other:  Robbie Louis, RN 10/27/2011  9:55 AM   Other:     Other:     Other:      Scribe for Treatment Team:  Sarina Ser, 10/27/2011, 9:55 AM
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
## 2011-10-27 NOTE — Progress Notes (Signed)
Pt has been up and visible in milieu today, pt has been irritable throughout much of the day today, did say that he wanted to be discharged and if not soon that he would become a problem for [place]. Pt spoke about voices and stated that he hears music, pt stated that he had a home to go to so he could discharge, pt asked for medications today and stated that he was becoming agitated, did receive haldol and ativan, support provided, will continue to monitor.
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
## 2011-10-27 NOTE — Progress Notes (Signed)
Blake Hardin is a 40 y.o. male [id number] 1971/01/27  Subjective/Objective: I saw the patient and review of the medical records. Patient has a long history of schizo affective disorder with multiple hospitalizations. Patient does not seem to be compliant with his and medications in the outpatient setting. Patient currently denies AVH hallucinations , denies suicidal or homicidal ideations. This less disorganized able to make a conversation. Patient brother has reported  that patient cannot come back to live there but the patient told us that the house is in both their names and he can stay there. Patient also has a history of polysubstance abuse and seems like he is using all kind of drugs before admission he was mixing lidocaine with a pancake. He is not interested in going to rehabilitation. Patient will get more collateral information from the family. I will start the patient on Haldol 5 mg at bedtime. Patient is on Haldol 50 mg IM injection monthly.   Filed Vitals:   10/27/11 0705  BP: 143/90  Pulse: 118  Temp:   Resp:     Lab Results:   BMET    Component Value Date/Time   NA 138 10/23/2011 1451   K 3.4* 10/23/2011 1451   CL 100 10/23/2011 1451   CO2 30 10/23/2011 1451   GLUCOSE 121* 10/23/2011 1451   BUN 10 10/23/2011 1451   CREATININE 0.91 10/23/2011 1451   CALCIUM 8.8 10/23/2011 1451   GFRNONAA >90 10/23/2011 1451   GFRAA >90 10/23/2011 1451    Medications:  Scheduled:     . nicotine  21 mg Transdermal Once  . nicotine  21 mg Transdermal Once     PRN Meds acetaminophen, alum & mag hydroxide-simeth, haloperidol, haloperidol lactate, LORazepam, magnesium hydroxide, traZODone   Lamia Mariner S MD 10/27/2011

## 2011-10-28 MED ORDER — HALOPERIDOL 5 MG PO TABS
10.0000 mg | ORAL_TABLET | Freq: Every day | ORAL | Status: DC
  Administered 2011-10-28: 10 mg via ORAL
  Filled 2011-10-28 (×2): qty 2

## 2011-10-28 NOTE — Progress Notes (Signed)
Blake Hardin is a 41 y.o. male [id number] [birth year]  Subjective/Objective:  Patient is doing better but he has a poor insight regarding his illness and the outpatient support system. Patient is irritable and agitated on and off but he can make a good conversation he denies having suicidal or homicidal ideations, denies hearing voices. Patient is compliant with the treatment. He wants to go to the day [facility] after discharge from here regarding his drug abuse. No side effects reported from Haldol I will increase the Haldol to 10 mg at bedtime. Estimated length of stay in the hospital will be 1-2 days.Ceasar Mons Vitals:   10/28/11 0824  BP: 124/83  Pulse: 121  Temp: 97.1 F (36.2 C)  Resp: 18    Lab Results:   BMET    Component Value Date/Time   NA 138 10/23/2011 1451   K 3.4* 10/23/2011 1451   CL 100 10/23/2011 1451   CO2 30 10/23/2011 1451   GLUCOSE 121* 10/23/2011 1451   BUN 10 10/23/2011 1451   CREATININE 0.91 10/23/2011 1451   CALCIUM 8.8 10/23/2011 1451   GFRNONAA >90 10/23/2011 1451   GFRAA >90 10/23/2011 1451    Medications:  Scheduled:     . haloperidol  5 mg Oral QHS  . nicotine  21 mg Transdermal Once     PRN Meds acetaminophen, alum & mag hydroxide-simeth, haloperidol, haloperidol lactate, LORazepam, magnesium hydroxide, traZODone   Rossie Bretado S MD 10/28/2011

## 2011-10-28 NOTE — Progress Notes (Signed)
Pt has been sleeping off and on, states he slept well, appetite good. Pt rates depression as 1, hopelessness as 1, physical c/o slight tremors. Pt denies SI/HI and has a goal of making a better life for himself. Pt continues to ask for Ativan 2 mg as often as he can have it, med seeking. To maintain safety.

## 2011-10-28 NOTE — Progress Notes (Signed)
[facility] Group Notes:  (Counselor/Nursing/MHT/Case Management/Adjunct)  10/28/2011 12:20 PM  Type of Therapy:  Group Therapy  Participation Level:  Active  Participation Quality:  Attentive and Sharing  Affect:  Irritable  Cognitive: Not oriented  Insight:  None  Engagement in Group:  Good  Engagement in Therapy:  Good  Modes of Intervention:  Clarification, Education and Exploration  Summary of Progress/Problems: Patient talked about his two daughters and wanting to be positive for them. When confronted about drug use and how this was positive, he told counselor she didn't know what she was talking about. He also stated that one daughter was going to be a Charity fundraiser at [employer] and he would rather her know how to create drugs for five dollars instead of paying millions. Continues to state that he gets along with everybody and its others that are negative around him.   Emmerich Cryer, Aram Beecham 10/28/2011, 12:20 PM

## 2011-10-28 NOTE — Progress Notes (Signed)
Patient ID: Blake Hardin, male   DOB: 01-08-1971, 40 y.o.   MRN: [id number] Pt. Is presently in bed resting quietly.
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
## 2011-10-29 MED ORDER — TRAZODONE HCL 100 MG PO TABS: 200.0000 mg | ORAL_TABLET | Freq: Every day | ORAL | Status: DC

## 2011-10-29 MED ORDER — HALOPERIDOL 10 MG PO TABS: 10.0000 mg | ORAL_TABLET | Freq: Every day | ORAL | Status: AC

## 2011-10-29 MED ORDER — NICOTINE 21 MG/24HR TD PT24
MEDICATED_PATCH | TRANSDERMAL | Status: AC
  Administered 2011-10-29: 21 mg via TRANSDERMAL
  Filled 2011-10-29: qty 1

## 2011-10-29 MED ORDER — HALOPERIDOL 10 MG PO TABS: 10.0000 mg | ORAL_TABLET | Freq: Every day | ORAL | Status: DC

## 2011-10-29 NOTE — Progress Notes (Signed)
Pt laying in bed resting with eyes closed. No distress noted. 

## 2011-10-29 NOTE — Progress Notes (Signed)
[facility] Group Notes:  (Counselor/Nursing/MHT/Case Management/Adjunct)  10/29/2011 2:45 PM  Type of Therapy:  Group Therapy  Participation Level:  Minimal  Participation Quality:  Attentive, Sharing and Supportive  Affect:  Anxious and Depressed  Cognitive:  Alert and Oriented  Insight:  Limited  Engagement in Group:  Limited  Engagement in Therapy:  Limited  Modes of Intervention:  Activity, Clarification, Problem-solving and Socialization    Summary of Progress/Problems: Pt participated in group by stating things for which he is grateful.  Pt took part in exercise by giving positive affirmations to others.  Pt stated he was grateful for support of his friend he was going to stay with upon dc today.  Pt responded well to positive affirmations.  Intervention effective.   Marni Griffon C 10/29/2011, 2:45 PM
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
## 2011-10-29 NOTE — Discharge Planning (Signed)
Case manager received phone call from [facility] switching appointment to Tuesday 11/27 due to scheduling conflict.  Case Manager contacted Rowland Lathe", patient's friend, and gave him this information.  He assured that he will be taking patient to appointments, and will do so at that time.  Ambrose Mantle, LCSW 10/29/2011, 4:19 PM

## 2011-10-29 NOTE — Discharge Planning (Signed)
Met with patient in Aftercare Planning Group, where he expressed readiness for discharge.  Seen in Treatment Team, see Tx Plan Update for details.  Called collateral Lannie Fields @ [phone], who confirmed he will be picking patient up, and will ensure that he does not return to the family home.  He stated he himself keeps telling patient not to go there.  He will ensure that patient makes his follow-up appointments, and expressed understanding at the plan that patient receive medication management and substance abuse group therapy at [facility].  Patient will either stay with his cousin, or with Peyton Najjar himself.  Ambrose Mantle, LCSW 10/29/2011, 11:26 AM

## 2011-10-29 NOTE — Progress Notes (Addendum)
[facility] Case Management Discharge Plan:  Will you be returning to the same living situation after discharge: No.  Will stay with friend and/or cousin Would you like a referral for services when you are discharged:Yes,  done, to [facility] Do you have access to transportation at discharge:Yes,  friend Peyton Najjar to pick up Do you have the ability to pay for your medications:Yes,  states the supply we can give will be helpful, and gets disability for payment of meds  Interagency Information:   Release of Information completed for patient to sign at d/c  Patient to Follow up at:  [facility] [address] [place]  On 11/03/11 at 8AM  Patient denies SI/HI:   Yes,  in Treatment Team, denies current SI and states he was never suicidal.  Continues to state this was his older brother    Aeronautical engineer and Suicide Prevention discussed:  Yes,  done with both family and with patient  Barrier to discharge identified:Yes,  cannot return to home, so alternative arrangements made  Summary and Recommendations:  Blake Hardin has been informed that we recommend continuation of Haldol monthly injection, as well as putting patient into substance abuse therapy groups.   Blake Hardin 10/29/2011, 11:27 AM

## 2011-10-29 NOTE — Progress Notes (Signed)
Recreation Therapy Group Note  Date: 10/29/2011         Time: 0930      Group Topic/Focus: The focus of this group is on discussing various styles of communication and communicating assertively using 'I' (feeling) statements.  Participation Level: Active  Participation Quality: Attentive  Affect: Appropriate  Cognitive: Oriented   Additional Comments: Patient joking with staff, showing little insight, reports he is ready for discharge today.  Reyden Smith 10/29/2011 11:44 AM

## 2011-10-29 NOTE — Progress Notes (Signed)
Suicide Risk Assessment  Discharge Assessment     Demographic factors:    Current Mental Status:    Risk Reduction Factors:     CLINICAL FACTORS:   Bipolar Disorder:   Mixed State  COGNITIVE FEATURES THAT CONTRIBUTE TO RISK:  none    SUICIDE RISK:   Minimal: No identifiable suicidal ideation.  Patients presenting with no risk factors but with morbid ruminations; may be classified as minimal risk based on the severity of the depressive symptoms  PLAN OF CARE: see discharge summary.  Blake Hardin S 10/29/2011, 10:43 AM

## 2011-10-31 NOTE — Progress Notes (Signed)
Patient Discharge Instructions: No signed consent for [facility] Audree Camel, 10/31/2011, 2:00 PM
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
## 2011-11-20 ENCOUNTER — Encounter (HOSPITAL_COMMUNITY): Payer: Self-pay | Admitting: Emergency Medicine

## 2011-11-20 ENCOUNTER — Emergency Department (HOSPITAL_COMMUNITY)
Admission: EM | Admit: 2011-11-20 | Discharge: 2011-11-20 | Disposition: A | Discharge status: Home / Self Care | Payer: Self-pay | Attending: Emergency Medicine | Admitting: Emergency Medicine

## 2011-11-20 DIAGNOSIS — Z79899 Other long term (current) drug therapy: Secondary | ICD-10-CM | POA: Insufficient documentation

## 2011-11-20 DIAGNOSIS — F259 Schizoaffective disorder, unspecified: Secondary | ICD-10-CM | POA: Insufficient documentation

## 2011-11-20 DIAGNOSIS — F319 Bipolar disorder, unspecified: Secondary | ICD-10-CM | POA: Insufficient documentation

## 2011-11-20 LAB — COMPREHENSIVE METABOLIC PANEL
ALT: 18 U/L (ref 0–53)
Albumin: 3.8 g/dL (ref 3.5–5.2)
Alkaline Phosphatase: 48 U/L (ref 39–117)
Chloride: 101 mEq/L (ref 96–112)
GFR calc Af Amer: >90 mL/min (ref >90)
Glucose, Bld: 86 mg/dL (ref 70–99)
Potassium: 3.6 mEq/L (ref 3.5–5.1)
Sodium: 139 mEq/L (ref 135–145)
Total Bilirubin: 0.1 mg/dL — ABNORMAL LOW (ref 0.3–1.2)
Total Protein: 7.1 g/dL (ref 6.0–8.3)

## 2011-11-20 LAB — RAPID URINE DRUG SCREEN, HOSP PERFORMED
Amphetamines: NOT DETECTED
Barbiturates: NOT DETECTED
Benzodiazepines: POSITIVE — AB

## 2011-11-20 LAB — CBC
MCV: 94.5 fL (ref 78.0–100.0)
Platelets: 365 10*3/uL (ref 150–400)
RDW: 14.2 % (ref 11.5–15.5)
WBC: 9.5 10*3/uL (ref 4.0–10.5)

## 2011-11-20 MED ORDER — LORAZEPAM 1 MG PO TABS
1.0000 mg | ORAL_TABLET | Freq: Once | ORAL | Status: AC
  Administered 2011-11-20: 1 mg via ORAL
  Filled 2011-11-20: qty 1

## 2011-11-20 MED ORDER — HALOPERIDOL 5 MG PO TABS: 10.0000 mg | ORAL_TABLET | ORAL | Status: DC

## 2011-11-20 MED ORDER — TRAZODONE HCL 50 MG PO TABS
100.0000 mg | ORAL_TABLET | Freq: Every day | ORAL | Status: DC
  Filled 2011-11-20 (×2): qty 2

## 2011-11-20 MED ORDER — NICOTINE 21 MG/24HR TD PT24
21.0000 mg | MEDICATED_PATCH | Freq: Once | TRANSDERMAL | Status: DC
  Administered 2011-11-20: 21 mg via TRANSDERMAL
  Filled 2011-11-20 (×2): qty 1

## 2011-11-20 MED ORDER — LORAZEPAM 1 MG PO TABS: 1.0000 mg | ORAL_TABLET | Freq: Three times a day (TID) | ORAL | Status: AC | PRN

## 2011-11-20 MED ORDER — HALOPERIDOL 5 MG PO TABS
10.0000 mg | ORAL_TABLET | Freq: Once | ORAL | Status: AC
  Administered 2011-11-20: 10 mg via ORAL
  Filled 2011-11-20: qty 2

## 2011-11-20 NOTE — ED Notes (Signed)
Discharge instructions reviewed with pt, questions answered. Pt verbalized understanding.  

## 2011-11-20 NOTE — ED Notes (Signed)
Pt states he was told by his family that he needed to come here. Pt states he "wants to be checked out and wants to know his blood type and wheat medications he should be taking". Pt states he has h/s bipolar and schizoaffective d/o. When asked if he wants to harm himself pt states "i don't know how". Denies hi.

## 2011-11-20 NOTE — ED Notes (Signed)
Pt had 4 belongings bags, returned to pt, pt verified that was all he had.

## 2011-11-20 NOTE — ED Notes (Signed)
Pt up to bathroom, toothbrush/toothpaste provided and taken after pt finished. Coffee provided for pt. Pt remains calm.

## 2011-11-20 NOTE — ED Notes (Signed)
MD at bedside. 
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
## 2011-11-20 NOTE — ED Provider Notes (Addendum)
History  Scribed for Donnetta Hutching, MD, the patient was seen in room APA15/APA15. This chart was scribed by Candelaria Stagers. The patient's care started at 4:01 PM    CSN: [id number] Arrival date & time: 11/20/2011  3:31 PM   First MD Initiated Contact with Patient 11/20/11 1547      Chief Complaint  Patient presents with  . Medical Clearance    The history is provided by the patient.   Blake Hardin is a 40 y.o. male who presents to the Emergency Department stating that his family dropped him off because he needs more medications.  Pt has a h/o bipolar and schizoaffective d/o.  He states that he is out of Valium, Trazodone, and yvance.  He states that his PCP, Dr. Betti Cruz in [place], is booked and he cannot see him for one month.  He reports that he was at [facility] last night and received haloperidol and benadryl.  Pt states that he typically gets refills of medications at various locations and is speaks vaguely of where he resides.     Past Medical History  Diagnosis Date  . Bipolar 1 disorder   . Schizoaffective disorder     Past Surgical History  Procedure Date  . Lung surgery   . Tonsillectomy   . Myringotomy     No family history on file.  History  Substance Use Topics  . Smoking status: Current Everyday Smoker    Types: Cigarettes  . Smokeless tobacco: Not on file  . Alcohol Use: Yes     states " when I feel like it"      Review of Systems  Constitutional: Negative for fever and chills.  HENT: Negative for rhinorrhea and neck pain.   Eyes: Negative for pain.  Respiratory: Negative for cough and shortness of breath.   Cardiovascular: Negative for chest pain.  Gastrointestinal: Negative for nausea, vomiting, abdominal pain and diarrhea.  Genitourinary: Negative for dysuria.  Musculoskeletal: Negative for back pain.  Skin: Negative for rash.  Neurological: Negative for dizziness and weakness.    Allergies  Penicillins  Home Medications    Current Outpatient Rx  Name Route Sig Dispense Refill  . HALOPERIDOL 10 MG PO TABS Oral Take 1 tablet (10 mg total) by mouth at bedtime. 30 tablet 0  . HALOPERIDOL DECANOATE 100 MG/ML IM SOLN Intramuscular Inject 50 mg into the muscle every 28 (twenty-eight) days.      Carma Leaven M PLUS PO TABS Oral Take 1 tablet by mouth daily.      . TRAZODONE HCL 100 MG PO TABS Oral Take 2 tablets (200 mg total) by mouth at bedtime. 60 tablet 0  . LORAZEPAM 1 MG PO TABS Oral Take 1 mg by mouth 2 (two) times daily.        BP 151/91  Pulse 112  Temp(Src) 97.5 F (36.4 C) (Oral)  Resp 18  SpO2 99%  Physical Exam  Nursing note and vitals reviewed. Constitutional: He is oriented to person, place, and time. He appears well-developed and well-nourished. No distress.  HENT:  Head: Normocephalic and atraumatic.  Eyes: EOM are normal. Pupils are equal, round, and reactive to light.  Neck: Neck supple. No tracheal deviation present.  Cardiovascular: Normal rate.   Pulmonary/Chest: Effort normal. No respiratory distress.  Abdominal: Soft. He exhibits no distension.  Musculoskeletal: Normal range of motion. He exhibits no edema.  Neurological: He is alert and oriented to person, place, and time. No sensory deficit.  Skin: Skin is  warm and dry.  Psychiatric:       Flight of ideas    ED Course  Procedures   Will consult with behavioral health.  DIAGNOSTIC STUDIES: Oxygen Saturation is 99% on room air, normal by my interpretation.    COORDINATION OF CARE:  4:07PM Ordered: Comprehensive metabolic panel  4:12PM Consult: Consult to call act team  4:46PM Ordered: nicotine (NICODERM CQ - dosed in mg/24 hours) patch 21 mg   Labs Reviewed  COMPREHENSIVE METABOLIC PANEL - Abnormal; Notable for the following:    Total Bilirubin 0.1 (*)    GFR calc non Af Amer 86 (*)    All other components within normal limits  URINE RAPID DRUG SCREEN (HOSP PERFORMED) - Abnormal; Notable for the following:     Benzodiazepines POSITIVE (*)    Tetrahydrocannabinol POSITIVE (*)    All other components within normal limits  CBC  ETHANOL   No results found.   No diagnosis found.    MDM  Will get BHS consult   I personally performed the services described in this documentation, which was scribed in my presence. The recorded information has been reviewed and considered.   Recheck at 2145: Discuss with behavioral health. No suicidal or homicidal ideation. Patient will followup with [facility] tomorrow.. refill prescriptions for several days        Donnetta Hutching, MD 11/20/11 1952  Donnetta Hutching, MD 11/20/11 2030  Donnetta Hutching, MD 11/20/11 2152

## 2011-11-20 NOTE — ED Notes (Signed)
Pt requesting meds to help him sleep. Will make MD aware. Pt also reporting a blister on the back of Lt heel.

## 2011-11-20 NOTE — ED Notes (Signed)
Pt stated he is not suicidal, to be discharged and follow up with mental health.

## 2011-11-24 LAB — TRAZODONE (DESYREL), BLOOD: Trazodone Lvl: <0.2 ug/mL — ABNORMAL LOW (ref 0.8–1.6)

## 2011-12-13 ENCOUNTER — Encounter (HOSPITAL_COMMUNITY): Payer: Self-pay

## 2011-12-13 ENCOUNTER — Emergency Department (HOSPITAL_COMMUNITY)
Admission: EM | Admit: 2011-12-13 | Discharge: 2011-12-13 | Disposition: A | Discharge status: Home / Self Care | Payer: Medicare Other | Attending: Emergency Medicine | Admitting: Emergency Medicine

## 2011-12-13 DIAGNOSIS — Z760 Encounter for issue of repeat prescription: Secondary | ICD-10-CM | POA: Insufficient documentation

## 2011-12-13 DIAGNOSIS — F411 Generalized anxiety disorder: Secondary | ICD-10-CM | POA: Insufficient documentation

## 2011-12-13 DIAGNOSIS — F17200 Nicotine dependence, unspecified, uncomplicated: Secondary | ICD-10-CM | POA: Insufficient documentation

## 2011-12-13 DIAGNOSIS — F319 Bipolar disorder, unspecified: Secondary | ICD-10-CM | POA: Insufficient documentation

## 2011-12-13 DIAGNOSIS — F259 Schizoaffective disorder, unspecified: Secondary | ICD-10-CM | POA: Insufficient documentation

## 2011-12-13 DIAGNOSIS — Z79899 Other long term (current) drug therapy: Secondary | ICD-10-CM | POA: Insufficient documentation

## 2011-12-13 DIAGNOSIS — F250 Schizoaffective disorder, bipolar type: Secondary | ICD-10-CM

## 2011-12-13 MED ORDER — LORAZEPAM 1 MG PO TABS: 1.0000 mg | ORAL_TABLET | Freq: Two times a day (BID) | ORAL | Status: DC

## 2011-12-13 MED ORDER — TRAZODONE HCL 50 MG PO TABS: 100.0000 mg | ORAL_TABLET | Freq: Every day | ORAL | Status: DC

## 2011-12-13 MED ORDER — HALOPERIDOL 5 MG PO TABS: 10.0000 mg | ORAL_TABLET | ORAL | Status: AC

## 2011-12-13 NOTE — ED Notes (Signed)
Pt brought to er by ems, "i got kicked out of [facility].  It was a mis-understanding, I threatened to blow the place up",   i just need my medicine and ill go home.

## 2011-12-13 NOTE — ED Notes (Signed)
Pt seen by md, given meal to eat, then given d/c papers, pt angry over not getting his meds while here in the er, explained that he could get scripts filled in the am at his pharmacy, he then stated that he already had other scripts and thought it was our job to get his meds.  rn repeated how to get his meds filled.  Security to bedside and escorted pt to lobby.  Pt then stated "how you gonna get me home now", off duty officer offered to call, and see if she could get pt a ride home to [place].

## 2011-12-13 NOTE — ED Provider Notes (Signed)
History     CSN: [id number]  Arrival date & time 12/13/11  0305   First MD Initiated Contact with Patient 12/13/11 [phone]      Chief Complaint  Patient presents with  . Medication Refill    (Consider location/radiation/quality/duration/timing/severity/associated sxs/prior treatment) The history is provided by the patient.   Patient here requesting a refill of his medications. Patient has a history of oriented to the emergency departments in having his medications filled for his mental illness. Was at another hospital this evening for the same complaint but was escorted out to 2 his behavior. Patient denies suicidal or homicidal ideations currently. Denies any auditory or visual hallucinations. Patient is currently out of his medications for the past 24 hours. Nothing makes the symptoms better or worse. No other medications taken prior to arrival. Past Medical History  Diagnosis Date  . Bipolar 1 disorder   . Schizoaffective disorder     Past Surgical History  Procedure Date  . Lung surgery   . Tonsillectomy   . Myringotomy     No family history on file.  History  Substance Use Topics  . Smoking status: Current Everyday Smoker    Types: Cigarettes  . Smokeless tobacco: Not on file  . Alcohol Use: Yes     states " when I feel like it"      Review of Systems  All other systems reviewed and are negative.    Allergies  Penicillins  Home Medications   Current Outpatient Rx  Name Route Sig Dispense Refill  . HALOPERIDOL 5 MG PO TABS Oral Take 2 tablets (10 mg total) by mouth 1 day or 1 dose. 14 tablet 0  . HALOPERIDOL DECANOATE 100 MG/ML IM SOLN Intramuscular Inject 50 mg into the muscle every 28 (twenty-eight) days.      Marland Kitchen LORAZEPAM 1 MG PO TABS Oral Take 1 mg by mouth 2 (two) times daily.      Carma Leaven M PLUS PO TABS Oral Take 1 tablet by mouth daily.      . TRAZODONE HCL 100 MG PO TABS Oral Take 2 tablets (200 mg total) by mouth at bedtime. 60 tablet 0    Ht 6'  (1.829 m)  Wt 151 lb (68.493 kg)  BMI 20.48 kg/m2  Physical Exam  Nursing note and vitals reviewed. Constitutional: He is oriented to person, place, and time. He appears well-developed and well-nourished.  Non-toxic appearance.  HENT:  Head: Normocephalic and atraumatic.  Eyes: Conjunctivae are normal. Pupils are equal, round, and reactive to light.  Neck: Normal range of motion.  Cardiovascular: Normal rate.   Pulmonary/Chest: Effort normal.  Neurological: He is alert and oriented to person, place, and time.  Skin: Skin is warm and dry.  Psychiatric: His mood appears anxious. His speech is rapid and/or pressured. He is not actively hallucinating. Thought content is not paranoid. He expresses no homicidal and no suicidal ideation.    ED Course  Procedures (including critical care time)  Labs Reviewed - No data to display No results found.   No diagnosis found.    MDM  Patient given refills for his Haldol, Ativan, trazodone. He will followup with his therapist on Monday and received the remaining prescriptions refills        Toy Baker, MD 12/13/11 [phone]

## 2011-12-18 ENCOUNTER — Encounter (HOSPITAL_COMMUNITY): Payer: Self-pay | Admitting: Emergency Medicine

## 2011-12-18 ENCOUNTER — Emergency Department (HOSPITAL_COMMUNITY)
Admission: EM | Admit: 2011-12-18 | Discharge: 2011-12-18 | Disposition: A | Discharge status: Home / Self Care | Payer: Medicare Other | Attending: Emergency Medicine | Admitting: Emergency Medicine

## 2011-12-18 DIAGNOSIS — R070 Pain in throat: Secondary | ICD-10-CM | POA: Insufficient documentation

## 2011-12-18 DIAGNOSIS — M25579 Pain in unspecified ankle and joints of unspecified foot: Secondary | ICD-10-CM | POA: Insufficient documentation

## 2011-12-18 DIAGNOSIS — S80811A Abrasion, right lower leg, initial encounter: Secondary | ICD-10-CM

## 2011-12-18 DIAGNOSIS — F259 Schizoaffective disorder, unspecified: Secondary | ICD-10-CM | POA: Insufficient documentation

## 2011-12-18 DIAGNOSIS — IMO0002 Reserved for concepts with insufficient information to code with codable children: Secondary | ICD-10-CM | POA: Insufficient documentation

## 2011-12-18 DIAGNOSIS — F319 Bipolar disorder, unspecified: Secondary | ICD-10-CM | POA: Insufficient documentation

## 2011-12-18 DIAGNOSIS — M25569 Pain in unspecified knee: Secondary | ICD-10-CM | POA: Insufficient documentation

## 2011-12-18 DIAGNOSIS — Z79899 Other long term (current) drug therapy: Secondary | ICD-10-CM | POA: Insufficient documentation

## 2011-12-18 DIAGNOSIS — J069 Acute upper respiratory infection, unspecified: Secondary | ICD-10-CM

## 2011-12-18 MED ORDER — BENZONATATE 100 MG PO CAPS: 100.0000 mg | ORAL_CAPSULE | Freq: Three times a day (TID) | ORAL | Status: AC

## 2011-12-18 NOTE — ED Notes (Addendum)
Pt c/o lower back/bilateral feet/leg pain. Pt states he twisted his left ankle and right knee on his scooter. Pt also c/o sore throat. Pt seen at [facility] yesterday for same.  nad noted.

## 2011-12-18 NOTE — ED Notes (Signed)
MD at bedside. 

## 2011-12-20 NOTE — ED Provider Notes (Signed)
History     CSN: [id number]  Arrival date & time 12/18/11  1249   First MD Initiated Contact with Patient 12/18/11 1539      Chief Complaint  Patient presents with  . Back Pain  . Knee Pain  . Ankle Pain    (Consider location/radiation/quality/duration/timing/severity/associated sxs/prior treatment) HPI Comments: Patient presents with several complaints,  First that he fell off his scooter 4 days ago,  Causing pain and abrasion to his right knee and pain to his left ankle.  He was seen at [facility] yesterday,  And he states his xrays were normal.  He continues to have pain.  He also reports sore throat for the past day.  He denies fever,  Sob,  Cough,  And nasal congestion.  He has taken motrin which does help his symptoms.  Last tetanus within past 2 years.  The history is provided by the patient.    Past Medical History  Diagnosis Date  . Bipolar 1 disorder   . Schizoaffective disorder     Past Surgical History  Procedure Date  . Lung surgery   . Tonsillectomy   . Myringotomy     History reviewed. No pertinent family history.  History  Substance Use Topics  . Smoking status: Current Everyday Smoker    Types: Cigarettes  . Smokeless tobacco: Not on file  . Alcohol Use: Yes     states " when I feel like it"      Review of Systems  Constitutional: Negative for fever.  HENT: Positive for sore throat. Negative for congestion, rhinorrhea, neck pain and neck stiffness.   Eyes: Negative.   Respiratory: Negative for cough, chest tightness and shortness of breath.   Cardiovascular: Negative for chest pain.  Gastrointestinal: Negative for nausea and abdominal pain.  Genitourinary: Negative.   Musculoskeletal: Positive for arthralgias. Negative for joint swelling.  Skin: Positive for wound. Negative for rash.  Neurological: Negative for dizziness, weakness, light-headedness, numbness and headaches.  Hematological: Negative.   Psychiatric/Behavioral: Negative.      Allergies  Penicillins  Home Medications   Current Outpatient Rx  Name Route Sig Dispense Refill  . HALOPERIDOL 5 MG PO TABS Oral Take 2 tablets (10 mg total) by mouth 1 day or 1 dose. 8 tablet 0  . LORAZEPAM 1 MG PO TABS Oral Take 1 mg by mouth 2 (two) times daily.      Carma Leaven M PLUS PO TABS Oral Take 1 tablet by mouth daily.      . TRAZODONE HCL 100 MG PO TABS Oral Take 2 tablets (200 mg total) by mouth at bedtime. 60 tablet 0  . BENZONATATE 100 MG PO CAPS Oral Take 1 capsule (100 mg total) by mouth every 8 (eight) hours. 21 capsule 0  . HALOPERIDOL DECANOATE 100 MG/ML IM SOLN Intramuscular Inject 50 mg into the muscle every 28 (twenty-eight) days.        BP 127/76  Pulse 115  Temp 97.8 F (36.6 C)  Resp 20  Ht 6' (1.829 m)  Wt 150 lb (68.04 kg)  BMI 20.34 kg/m2  SpO2 99%  Physical Exam  Nursing note and vitals reviewed. Constitutional: He is oriented to person, place, and time. He appears well-developed and well-nourished.  HENT:  Head: Normocephalic and atraumatic.  Eyes: Conjunctivae are normal.  Neck: Normal range of motion.  Cardiovascular: Normal rate, regular rhythm, normal heart sounds and intact distal pulses.   Pulmonary/Chest: Effort normal and breath sounds normal. He has no  wheezes.  Abdominal: Soft. Bowel sounds are normal. There is no tenderness.  Musculoskeletal: Normal range of motion. He exhibits no edema and no tenderness.       Right shoulder: He exhibits normal range of motion, no tenderness, no bony tenderness, no swelling, no deformity and no pain.       Patient ambulatory without difficulty  Neurological: He is alert and oriented to person, place, and time.  Skin: Skin is warm and dry.       Small,  Well healing abrasion right mid tibia  Psychiatric: He has a normal mood and affect.    ED Course  Procedures (including critical care time)  Labs Reviewed - No data to display No results found.   1. Upper respiratory infection   2.  Abrasion of right lower leg       MDM  Normal exam except for abrasion. Tessalon prescribed for cough        Candis Musa, [place] 12/20/11 1144

## 2011-12-20 NOTE — ED Provider Notes (Signed)
Medical screening examination/treatment/procedure(s) were performed by non-physician practitioner and as supervising physician I was immediately available for consultation/collaboration.   Joya Gaskins, MD 12/20/11 1630

## 2013-05-05 ENCOUNTER — Encounter (HOSPITAL_COMMUNITY): Payer: Self-pay | Admitting: Emergency Medicine

## 2013-05-05 ENCOUNTER — Emergency Department (HOSPITAL_COMMUNITY)
Admission: EM | Admit: 2013-05-05 | Discharge: 2013-05-05 | Disposition: A | Discharge status: Home / Self Care | Payer: Medicare Other | Attending: Emergency Medicine | Admitting: Emergency Medicine

## 2013-05-05 DIAGNOSIS — Z79899 Other long term (current) drug therapy: Secondary | ICD-10-CM | POA: Insufficient documentation

## 2013-05-05 DIAGNOSIS — Z8619 Personal history of other infectious and parasitic diseases: Secondary | ICD-10-CM | POA: Insufficient documentation

## 2013-05-05 DIAGNOSIS — Z880 Allergy status to penicillin: Secondary | ICD-10-CM | POA: Insufficient documentation

## 2013-05-05 DIAGNOSIS — B029 Zoster without complications: Secondary | ICD-10-CM | POA: Insufficient documentation

## 2013-05-05 DIAGNOSIS — F17200 Nicotine dependence, unspecified, uncomplicated: Secondary | ICD-10-CM | POA: Insufficient documentation

## 2013-05-05 DIAGNOSIS — F259 Schizoaffective disorder, unspecified: Secondary | ICD-10-CM | POA: Insufficient documentation

## 2013-05-05 DIAGNOSIS — F319 Bipolar disorder, unspecified: Secondary | ICD-10-CM | POA: Insufficient documentation

## 2013-05-05 HISTORY — DX: Varicella without complication: B01.9

## 2013-05-05 MED ORDER — FAMCICLOVIR 500 MG PO TABS: 500.0000 mg | ORAL_TABLET | Freq: Three times a day (TID) | ORAL | Status: DC

## 2013-05-05 MED ORDER — HYDROCODONE-ACETAMINOPHEN 5-325 MG PO TABS: 1.0000 | ORAL_TABLET | Freq: Four times a day (QID) | ORAL | Status: DC | PRN

## 2013-05-05 NOTE — ED Provider Notes (Signed)
History    This chart was scribed for Blake Lennert, MD by Blake Hardin, ED Scribe. The patient was seen in room APA03/APA03. Patient's care was started at 7:33 PM.    CSN: [id number]  Arrival date & time 05/05/13  1757   First MD Initiated Contact with Patient 05/05/13 1933      Chief Complaint  Patient presents with  . Arm Pain    (Consider location/radiation/quality/duration/timing/severity/associated sxs/prior treatment) Patient is a 42 y.o. male presenting with rash. The history is provided by the patient (pt has painful rash to arm). No language interpreter was used.  Rash Pain location: right arm. Pain quality: aching   Pain radiates to:  Does not radiate Pain severity:  Moderate Onset quality:  Gradual Timing:  Constant Associated symptoms: no chest pain, no cough, no diarrhea, no fatigue and no hematuria    HPI Comments: Blake Hardin is a 42 y.o. male with h/o bipolar disorder and schizophrenia who presents to the Emergency Department complaining of moderate constant right arm pain onset 5 days ago with an associated rash that started 2 days ago. He reports that the rash progressed last night. He believes sx's could be related to shingles. There various red blotches all over pt's right arm. Pt denies fever, chills, cough, nausea, vomiting, diarrhea, SOB, weakness, and any other associated symptoms. Pt's current PCP is Dr. Loney Hardin.   Past Medical History  Diagnosis Date  . Bipolar 1 disorder   . Schizoaffective disorder   . Chicken pox     Past Surgical History  Procedure Laterality Date  . Lung surgery    . Tonsillectomy    . Myringotomy      History reviewed. No pertinent family history.  History  Substance Use Topics  . Smoking status: Current Every Day Smoker    Types: Cigarettes  . Smokeless tobacco: Not on file  . Alcohol Use: Yes     Comment: 3-4x a week beer      Review of Systems  Constitutional: Negative for appetite change and fatigue.   HENT: Negative for congestion, sinus pressure and ear discharge.   Eyes: Negative for discharge.  Respiratory: Negative for cough.   Cardiovascular: Negative for chest pain.  Gastrointestinal: Negative for abdominal pain and diarrhea.  Genitourinary: Negative for frequency and hematuria.  Musculoskeletal: Negative for back pain.  Skin: Positive for rash.  Neurological: Negative for seizures and headaches.  Psychiatric/Behavioral: Negative for hallucinations.    Allergies  Penicillins  Home Medications   Current Outpatient Rx  Name  Route  Sig  Dispense  Refill  . benztropine (COGENTIN) 1 MG tablet   Oral   Take 1 mg by mouth 2 (two) times daily.         . traZODone (DESYREL) 50 MG tablet   Oral   Take 50 mg by mouth at bedtime.         . haloperidol decanoate (HALDOL DECANOATE) 100 MG/ML injection   Intramuscular   Inject 50 mg into the muscle every 28 (twenty-eight) days.             BP 133/93  Pulse 84  Temp(Src) 98.2 F (36.8 C) (Oral)  Resp 17  SpO2 100%  Physical Exam  Nursing note and vitals reviewed. Constitutional: He is oriented to person, place, and time. He appears well-developed.  HENT:  Head: Normocephalic.  Eyes: Conjunctivae are normal.  Neck: No tracheal deviation present.  Cardiovascular:  No murmur heard. Musculoskeletal: Normal range of motion.  Neurological: He is oriented to person, place, and time.  Skin: Skin is warm.  Vesicular rash on pt's right arm consistent to shingles.  Psychiatric: He has a normal mood and affect.    ED Course  Procedures (including critical care time) DIAGNOSTIC STUDIES: Oxygen Saturation is 100% on room air, normal by my interpretation.    COORDINATION OF CARE: 7:39 PM Discussed ED treatment with pt and pt agrees.     Labs Reviewed - No data to display No results found.   No diagnosis found.    MDM       The chart was scribed for me under my direct supervision.  I personally  performed the history, physical, and medical decision making and all procedures in the evaluation of this patient.Blake Lennert, MD 05/05/13 2006

## 2013-05-05 NOTE — ED Notes (Signed)
Pt c/o R arm pain x 5 days ago, states rash started x 2 days ago. Pt states "I think it's shingles". Red areas noted to r arm.

## 2013-05-05 NOTE — ED Notes (Signed)
Possible shingles. Area wrapped.

## 2014-11-13 ENCOUNTER — Encounter (HOSPITAL_COMMUNITY): Payer: Self-pay | Admitting: *Deleted

## 2014-11-13 ENCOUNTER — Emergency Department (HOSPITAL_COMMUNITY)
Admission: EM | Admit: 2014-11-13 | Discharge: 2014-11-13 | Disposition: A | Discharge status: Home / Self Care | Payer: Medicare Other | Attending: Emergency Medicine | Admitting: Emergency Medicine

## 2014-11-13 DIAGNOSIS — R0602 Shortness of breath: Secondary | ICD-10-CM | POA: Diagnosis present

## 2014-11-13 DIAGNOSIS — Z8619 Personal history of other infectious and parasitic diseases: Secondary | ICD-10-CM | POA: Insufficient documentation

## 2014-11-13 DIAGNOSIS — Z720 Tobacco use: Secondary | ICD-10-CM | POA: Diagnosis not present

## 2014-11-13 DIAGNOSIS — Z79899 Other long term (current) drug therapy: Secondary | ICD-10-CM | POA: Insufficient documentation

## 2014-11-13 DIAGNOSIS — J209 Acute bronchitis, unspecified: Secondary | ICD-10-CM | POA: Diagnosis not present

## 2014-11-13 DIAGNOSIS — R42 Dizziness and giddiness: Secondary | ICD-10-CM | POA: Insufficient documentation

## 2014-11-13 DIAGNOSIS — Z8659 Personal history of other mental and behavioral disorders: Secondary | ICD-10-CM | POA: Insufficient documentation

## 2014-11-13 DIAGNOSIS — J40 Bronchitis, not specified as acute or chronic: Secondary | ICD-10-CM

## 2014-11-13 DIAGNOSIS — Z880 Allergy status to penicillin: Secondary | ICD-10-CM | POA: Insufficient documentation

## 2014-11-13 MED ORDER — AZITHROMYCIN 250 MG PO TABS: 250.0000 mg | ORAL_TABLET | Freq: Every day | ORAL | Status: DC

## 2014-11-13 MED ORDER — BENZONATATE 100 MG PO CAPS: 100.0000 mg | ORAL_CAPSULE | Freq: Three times a day (TID) | ORAL | Status: DC

## 2014-11-13 MED ORDER — BENZONATATE 100 MG PO CAPS
100.0000 mg | ORAL_CAPSULE | Freq: Once | ORAL | Status: AC
Start: 2014-11-13 — End: 2014-11-13
  Administered 2014-11-13: 100 mg via ORAL
  Filled 2014-11-13: qty 1

## 2014-11-13 MED ORDER — AEROCHAMBER PLUS FLO-VU LARGE MISC
1.0000 | Freq: Once | Status: AC
  Administered 2014-11-13: 1

## 2014-11-13 MED ORDER — ALBUTEROL SULFATE HFA 108 (90 BASE) MCG/ACT IN AERS
2.0000 | INHALATION_SPRAY | RESPIRATORY_TRACT | Status: AC
  Administered 2014-11-13: 2 via RESPIRATORY_TRACT
  Filled 2014-11-13: qty 6.7

## 2014-11-13 NOTE — ED Provider Notes (Signed)
CSN: [id number]     Arrival date & time 11/13/14  2119 History  This chart was scribed for Blake Acosta, MD by Rayfield Citizen, ED Scribe. This patient was seen in room APA05/APA05 and the patient's care was started at 9:32 PM.    Chief Complaint  Patient presents with  . Shortness of Breath   The history is provided by the patient. No language interpreter was used.     HPI Comments: Blake Hardin is a 43 y.o. male who presents to the Emergency Department complaining of 1 week of gradually worsening dry cough and sinus congestion, acutely worsening within the past 24 hours. He now has SOB with mild lightheadedness; he "can't breathe or talk," he feels like "there's something in his throat." He denies fevers.   Patient is a current smoker.   Past Medical History  Diagnosis Date  . Bipolar 1 disorder   . Schizoaffective disorder   . Chicken pox    Past Surgical History  Procedure Laterality Date  . Lung surgery    . Tonsillectomy    . Myringotomy     History reviewed. No pertinent family history. History  Substance Use Topics  . Smoking status: Current Every Day Smoker    Types: Cigarettes  . Smokeless tobacco: Not on file  . Alcohol Use: Yes     Comment: 3-4x a week beer    Review of Systems  HENT: Positive for congestion.   Respiratory: Positive for cough and shortness of breath.   Neurological: Positive for light-headedness.  All other systems reviewed and are negative.   Allergies  Penicillins  Home Medications   Prior to Admission medications   Medication Sig Start Date End Date Taking? Authorizing Provider  azithromycin (ZITHROMAX Z-PAK) 250 MG tablet Take 1 tablet (250 mg total) by mouth daily. 500mg  PO day 1, then 250mg  PO days 205 11/13/14   Blake Acosta, MD  benzonatate (TESSALON) 100 MG capsule Take 1 capsule (100 mg total) by mouth every 8 (eight) hours. 11/13/14   Blake Acosta, MD  benztropine (COGENTIN) 1 MG tablet Take 1 mg by mouth 2 (two) times  daily.    Historical Provider, MD  famciclovir (FAMVIR) 500 MG tablet Take 1 tablet (500 mg total) by mouth 3 (three) times daily. 05/05/13   Maudry Diego, MD  haloperidol decanoate (HALDOL DECANOATE) 100 MG/ML injection Inject 50 mg into the muscle every 28 (twenty-eight) days.      Historical Provider, MD  HYDROcodone-acetaminophen (NORCO/VICODIN) 5-325 MG per tablet Take 1 tablet by mouth every 6 (six) hours as needed for pain. 05/05/13   Maudry Diego, MD  traZODone (DESYREL) 50 MG tablet Take 50 mg by mouth at bedtime.    Historical Provider, MD   BP 130/84 mmHg  Pulse 101  Temp(Src) 98.4 F (36.9 C) (Oral)  Resp 20  Ht 6' (1.829 m)  Wt 161 lb (73.029 kg)  BMI 21.83 kg/m2  SpO2 98% Physical Exam  Constitutional: He appears well-developed and well-nourished. No distress.  HENT:  Head: Normocephalic and atraumatic.  Mouth/Throat: Oropharynx is clear and moist. No oropharyngeal exudate.  Slight hoarse voice  Eyes: Conjunctivae and EOM are normal. Pupils are equal, round, and reactive to light. Right eye exhibits no discharge. Left eye exhibits no discharge. No scleral icterus.  Neck: Normal range of motion. Neck supple. No JVD present. No thyromegaly present.  Cardiovascular: Normal rate, regular rhythm, normal heart sounds and intact distal pulses.  Exam reveals  no gallop and no friction rub.   No murmur heard. Pulmonary/Chest: Effort normal and breath sounds normal. No respiratory distress. He has no wheezes. He has no rales.  Abdominal: Soft. Bowel sounds are normal. He exhibits no distension and no mass. There is no tenderness.  Musculoskeletal: Normal range of motion. He exhibits no edema or tenderness.  Lymphadenopathy:    He has no cervical adenopathy.  Neurological: He is alert. Coordination normal.  Skin: Skin is warm and dry. No rash noted. No erythema.  Psychiatric: He has a normal mood and affect. His behavior is normal.  Nursing note and vitals reviewed.   ED  Course  Procedures   DIAGNOSTIC STUDIES: Oxygen Saturation is 98% on Ra, normal by my interpretation.    COORDINATION OF CARE: 9:34 PM Discussed treatment plan with pt at bedside and pt agreed to plan.   Labs Review Labs Reviewed - No data to display  Imaging Review No results found.    MDM   Final diagnoses:  Bronchitis   Well appearing, VS normal, no fever, no tachy, no hypoxia.  Scant occasional wheezing, meds as below - no indication for imaging, pt in agreement.  Meds given in ED:  Medications  albuterol (PROVENTIL HFA;VENTOLIN HFA) 108 (90 BASE) MCG/ACT inhaler 2 puff (not administered)  benzonatate (TESSALON) capsule 100 mg (not administered)    New Prescriptions   AZITHROMYCIN (ZITHROMAX Z-PAK) 250 MG TABLET    Take 1 tablet (250 mg total) by mouth daily. 500mg  PO day 1, then 250mg  PO days 205   BENZONATATE (TESSALON) 100 MG CAPSULE    Take 1 capsule (100 mg total) by mouth every 8 (eight) hours.      I personally performed the services described in this documentation, which was scribed in my presence. The recorded information has been reviewed and is accurate.      Blake Acosta, MD 11/13/14 2137

## 2014-11-13 NOTE — ED Notes (Signed)
Pt to department via EMS.  Per report, pt has had cough and SOB x 2 days.  EMS administered nebulizer in route.  Pt showing no visible signs of distress at present.

## 2014-11-13 NOTE — Discharge Instructions (Signed)
Please call your doctor for a followup appointment within 24-48 hours. When you talk to your doctor please let them know that you were seen in the emergency department and have them acquire all of your records so that they can discuss the findings with you and formulate a treatment plan to fully care for your new and ongoing problems.  Use the inhaler 2 puffs every 4 hours for 24 hours

## 2014-11-13 NOTE — ED Notes (Signed)
Patient states he has had a cough with congestion X1 week. Patient states today he became short of breath. L&LV7

## 2014-11-13 NOTE — ED Notes (Signed)
Patient verbalizes understanding of discharge instructions, prescription medications, and follow up care. Patient demonstrates proper use if inhailer as well. At this time, patient is ambulatory out of department.

## 2015-05-08 DIAGNOSIS — F250 Schizoaffective disorder, bipolar type: Secondary | ICD-10-CM | POA: Diagnosis not present

## 2015-07-28 ENCOUNTER — Encounter (HOSPITAL_COMMUNITY): Payer: Self-pay | Admitting: Emergency Medicine

## 2015-07-28 ENCOUNTER — Emergency Department (HOSPITAL_COMMUNITY)
Admission: EM | Admit: 2015-07-28 | Discharge: 2015-07-29 | Disposition: A | Discharge status: Home / Self Care | Payer: Medicare Other | Attending: Emergency Medicine | Admitting: Emergency Medicine

## 2015-07-28 DIAGNOSIS — F259 Schizoaffective disorder, unspecified: Secondary | ICD-10-CM | POA: Insufficient documentation

## 2015-07-28 DIAGNOSIS — Z046 Encounter for general psychiatric examination, requested by authority: Secondary | ICD-10-CM | POA: Diagnosis present

## 2015-07-28 DIAGNOSIS — F1910 Other psychoactive substance abuse, uncomplicated: Secondary | ICD-10-CM | POA: Diagnosis present

## 2015-07-28 DIAGNOSIS — Z8619 Personal history of other infectious and parasitic diseases: Secondary | ICD-10-CM | POA: Insufficient documentation

## 2015-07-28 DIAGNOSIS — F22 Delusional disorders: Secondary | ICD-10-CM | POA: Insufficient documentation

## 2015-07-28 DIAGNOSIS — F258 Other schizoaffective disorders: Secondary | ICD-10-CM

## 2015-07-28 DIAGNOSIS — Z79899 Other long term (current) drug therapy: Secondary | ICD-10-CM | POA: Diagnosis not present

## 2015-07-28 DIAGNOSIS — F1210 Cannabis abuse, uncomplicated: Secondary | ICD-10-CM | POA: Insufficient documentation

## 2015-07-28 DIAGNOSIS — Z880 Allergy status to penicillin: Secondary | ICD-10-CM | POA: Diagnosis not present

## 2015-07-28 DIAGNOSIS — F1994 Other psychoactive substance use, unspecified with psychoactive substance-induced mood disorder: Secondary | ICD-10-CM | POA: Diagnosis present

## 2015-07-28 DIAGNOSIS — Z720 Tobacco use: Secondary | ICD-10-CM | POA: Insufficient documentation

## 2015-07-28 LAB — COMPREHENSIVE METABOLIC PANEL
ALT: 18 U/L (ref 17–63)
AST: 19 U/L (ref 15–41)
Albumin: 3.7 g/dL (ref 3.5–5.0)
Alkaline Phosphatase: 45 U/L (ref 38–126)
Anion gap: 6 (ref 5–15)
BUN: 17 mg/dL (ref 6–20)
CHLORIDE: 108 mmol/L (ref 101–111)
CO2: 26 mmol/L (ref 22–32)
CREATININE: 1.36 mg/dL — AB (ref 0.61–1.24)
Calcium: 8.2 mg/dL — ABNORMAL LOW (ref 8.9–10.3)
GFR calc non Af Amer: >60 mL/min (ref >60)
Glucose, Bld: 88 mg/dL (ref 65–99)
Potassium: 3.8 mmol/L (ref 3.5–5.1)
SODIUM: 140 mmol/L (ref 135–145)
Total Bilirubin: 0.3 mg/dL (ref 0.3–1.2)
Total Protein: 6.4 g/dL — ABNORMAL LOW (ref 6.5–8.1)

## 2015-07-28 LAB — CBC WITH DIFFERENTIAL/PLATELET
BASOS ABS: 0.1 10*3/uL (ref 0.0–0.1)
BASOS PCT: 1 % (ref 0–1)
EOS ABS: 0.2 10*3/uL (ref 0.0–0.7)
EOS PCT: 3 % (ref 0–5)
HCT: 38.2 % — ABNORMAL LOW (ref 39.0–52.0)
Hemoglobin: 13.2 g/dL (ref 13.0–17.0)
Lymphocytes Relative: 25 % (ref 12–46)
Lymphs Abs: 1.7 10*3/uL (ref 0.7–4.0)
MCH: 31.4 pg (ref 26.0–34.0)
MCHC: 34.6 g/dL (ref 30.0–36.0)
MCV: 90.7 fL (ref 78.0–100.0)
Monocytes Absolute: 0.5 10*3/uL (ref 0.1–1.0)
Monocytes Relative: 7 % (ref 3–12)
Neutro Abs: 4.5 10*3/uL (ref 1.7–7.7)
Neutrophils Relative %: 66 % (ref 43–77)
PLATELETS: 271 10*3/uL (ref 150–400)
RBC: 4.21 MIL/uL — AB (ref 4.22–5.81)
RDW: 13 % (ref 11.5–15.5)
WBC: 6.9 10*3/uL (ref 4.0–10.5)

## 2015-07-28 LAB — RAPID URINE DRUG SCREEN, HOSP PERFORMED
Amphetamines: NOT DETECTED
BARBITURATES: NOT DETECTED
Benzodiazepines: NOT DETECTED
COCAINE: NOT DETECTED
Opiates: NOT DETECTED
TETRAHYDROCANNABINOL: POSITIVE — AB

## 2015-07-28 LAB — ACETAMINOPHEN LEVEL

## 2015-07-28 LAB — ETHANOL: Alcohol, Ethyl (B): <5 mg/dL (ref <5)

## 2015-07-28 LAB — SALICYLATE LEVEL: Salicylate Lvl: <4 mg/dL (ref 2.8–30.0)

## 2015-07-28 MED ORDER — ACETAMINOPHEN 325 MG PO TABS: 650.0000 mg | ORAL_TABLET | ORAL | Status: DC | PRN

## 2015-07-28 MED ORDER — HALOPERIDOL 5 MG PO TABS: 10.0000 mg | ORAL_TABLET | Freq: Every day | ORAL | Status: DC | PRN

## 2015-07-28 MED ORDER — ALUM & MAG HYDROXIDE-SIMETH 200-200-20 MG/5ML PO SUSP: 30.0000 mL | ORAL | Status: DC | PRN

## 2015-07-28 MED ORDER — NICOTINE 21 MG/24HR TD PT24
21.0000 mg | MEDICATED_PATCH | Freq: Once | TRANSDERMAL | Status: AC
  Administered 2015-07-28: 21 mg via TRANSDERMAL
  Filled 2015-07-28: qty 1

## 2015-07-28 MED ORDER — LORAZEPAM 1 MG PO TABS
1.0000 mg | ORAL_TABLET | Freq: Three times a day (TID) | ORAL | Status: DC | PRN
  Administered 2015-07-28: 1 mg via ORAL
  Filled 2015-07-28: qty 1

## 2015-07-28 MED ORDER — LORAZEPAM 1 MG PO TABS
2.0000 mg | ORAL_TABLET | Freq: Once | ORAL | Status: AC
  Administered 2015-07-28: 2 mg via ORAL
  Filled 2015-07-28: qty 2

## 2015-07-28 MED ORDER — IBUPROFEN 400 MG PO TABS: 600.0000 mg | ORAL_TABLET | Freq: Three times a day (TID) | ORAL | Status: DC | PRN

## 2015-07-28 MED ORDER — TRAZODONE HCL 50 MG PO TABS: 50.0000 mg | ORAL_TABLET | Freq: Every evening | ORAL | Status: DC | PRN

## 2015-07-28 MED ORDER — GABAPENTIN 300 MG PO CAPS
300.0000 mg | ORAL_CAPSULE | Freq: Four times a day (QID) | ORAL | Status: DC
  Administered 2015-07-28 – 2015-07-29 (×2): 300 mg via ORAL
  Filled 2015-07-28 (×2): qty 1

## 2015-07-28 MED ORDER — ONDANSETRON HCL 4 MG PO TABS: 4.0000 mg | ORAL_TABLET | Freq: Three times a day (TID) | ORAL | Status: DC | PRN

## 2015-07-28 NOTE — ED Notes (Signed)
Patient resting, eyes closed. [facility] remains at bedside.

## 2015-07-28 NOTE — ED Notes (Signed)
Telepsych in progress. 

## 2015-07-28 NOTE — ED Notes (Signed)
IVC paperwork faxed to [place] per request by [facility].

## 2015-07-28 NOTE — ED Notes (Signed)
Patient states "My daughter is scared that I am going to hurt her because I want to see my grandbaby." Patient states, "I am not going to hurt anybody. I just want to see my grandbaby."

## 2015-07-28 NOTE — ED Notes (Signed)
Patient belongings: 1 pair of brown shoes 1 shirt, 1 jacket 1 pair of jeans 1 wallet with multiple paper cards and [employer] ID card (no cash or credit/debit cards present) and long metal chain 1 wooden wooden art craft 1 silver dollar 1 pack of cigarettes and lighter 1 set of generic keys Assortment of highlighters 1 pair of plastic sunglasses

## 2015-07-28 NOTE — ED Notes (Signed)
Patient resting. Ate dinner without difficulty.
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
## 2015-07-28 NOTE — ED Notes (Signed)
Patient agitated, stating he is going home. States " I'm going to go off and start acting out". Informed patient he is to stay overnight for observation and to be reassessed in the morning. Patient not happy, stating his girlfriend is here to pick him up. MD at bedside and verbal order for 2 mg ativan and a nicotine patch obtained.

## 2015-07-28 NOTE — ED Notes (Signed)
Monitor placed in the room for assessment.

## 2015-07-28 NOTE — ED Notes (Signed)
[facility] at bedside. Patient cooperative with staff. H/o schizophrenia, bipolar disorder. Belongings inventoried and placed in locked ED locker.
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
## 2015-07-29 DIAGNOSIS — F1910 Other psychoactive substance abuse, uncomplicated: Secondary | ICD-10-CM | POA: Diagnosis present

## 2015-07-29 DIAGNOSIS — F258 Other schizoaffective disorders: Secondary | ICD-10-CM

## 2015-07-29 DIAGNOSIS — F1994 Other psychoactive substance use, unspecified with psychoactive substance-induced mood disorder: Secondary | ICD-10-CM | POA: Diagnosis present

## 2015-07-29 DIAGNOSIS — F259 Schizoaffective disorder, unspecified: Secondary | ICD-10-CM | POA: Diagnosis present

## 2015-07-29 NOTE — ED Provider Notes (Signed)
TTS has re-evaluated pt today: states no criteria to IVC or admit to psychiatric facility at this time. Pt denies SI/HI/hallucinations and dose not appear to be responding to internal stimuli. IVC rescinded. Pt d/c stable and with outpt resources.    Francine Graven, DO 07/29/15 [phone]

## 2015-07-29 NOTE — ED Notes (Signed)
Patient has been resting in bed with eyes closed or most of the night. NAD noted. No outbursts noted tonight.

## 2015-07-29 NOTE — ED Notes (Signed)
Security in to accompany to shower.  Pt given toilettes.

## 2015-07-29 NOTE — ED Notes (Signed)
Pt has male visitor.  Informed that visitors hours end at 0900.  Visitor attempting to bring in books from home (bible).  Informed that pt already has bible that was provided on previous shift.
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
## 2015-07-29 NOTE — ED Notes (Signed)
2nd TTS completed.

## 2015-07-29 NOTE — ED Notes (Signed)
Completed shower without incidence.

## 2015-07-29 NOTE — ED Notes (Signed)
Taken to shower by Sports administrator Simona Huh and Jenny Reichmann).  Pt calm.  Linen changed.

## 2015-07-29 NOTE — ED Notes (Signed)
Spoke with Conner form [place] regarding pts behavior during this shift.  Conner plan to further evaluate pt and call back with plans.

## 2015-07-29 NOTE — Discharge Instructions (Signed)
°Emergency Department Resource Guide °1) Find a Doctor and Pay Out of Pocket °Although you won't have to find out who is covered by your insurance plan, it is a good idea to ask around and get recommendations. You will then need to call the office and see if the doctor you have chosen will accept you as a new patient and what types of options they offer for patients who are self-pay. Some doctors offer discounts or will set up payment plans for their patients who do not have insurance, but you will need to ask so you aren't surprised when you get to your appointment. ° °2) Contact Your Local Health Department °Not all health departments have doctors that can see patients for sick visits, but many do, so it is worth a call to see if yours does. If you don't know where your local health department is, you can check in your phone book. The CDC also has a tool to help you locate your state's health department, and many state websites also have listings of all of their local health departments. ° °3) Find a Walk-in Clinic °If your illness is not likely to be very severe or complicated, you may want to try a walk in clinic. These are popping up all over the country in pharmacies, drugstores, and shopping centers. They're usually staffed by nurse practitioners or physician assistants that have been trained to treat common illnesses and complaints. They're usually fairly quick and inexpensive. However, if you have serious medical issues or chronic medical problems, these are probably not your best option. ° °No Primary Care Doctor: °- Call [facility] at  832-8000 - they can help you locate a primary care doctor that  accepts your insurance, provides certain services, etc. °- Physician Referral Service- 1-800-533-3463 ° °Chronic Pain Problems: °Organization         Address  Phone   Notes  °[facility]  (336) 297-2271 Patients need to be referred by their primary care doctor.  ° °Medication  Assistance: °Organization         Address  Phone   Notes  °[place] [facility] [address] °[place], [place] [address] (336) 641-8030 --Must be a resident of [place] °-- Must have NO insurance coverage whatsoever (no Medicaid/ Medicare, etc.) °-- The pt. MUST have a primary care doctor that directs their care regularly and follows them in the community °  °[facility]  (866) 331-1348   °[employer]  (888) 892-1162   ° °Agencies that provide inexpensive medical care: °Organization         Address  Phone   Notes  °[facility]  (336) 832-8035   °[facility]    (336) 832-7272   °[facility] [address] °[place], [place] [address] (336) 832-4777   °[facility] [address], °[place] (336) 271-4999   °[facility]    (336) 373-0678   °[facility]    (336) 272-1050   °[facility] ° [address], [place] Phone:  (336) 832-4444, Fax:  (336) 832-4440 Hours of Operation:  9 am - 6 pm, M-F.  Also accepts Medicaid/Medicare and self-pay.  °[facility] ° [address], [place] Phone: (336) 832-3150, Fax: (336) 832-3151. Hours of Operation:  8:30 am - 5:30 pm, M-F.  Also accepts Medicaid and self-pay.  °[facility] [place] [address], [place] Phone: (336) 878-6027   °[facility] [address], [place], [place] (336)723-1848, Ext. 123 Mondays & Thursdays: 7-9 AM.  First 15 patients are seen on a first come, first serve basis. °  ° °Medicaid-accepting [place] Providers: ° °Organization         Address  Phone   Notes  °[facility] [address], [place] (336) 641-2100 Also accepts self-pay patients.  °[facility] [address], [place] ° (336) 856-9996   °[facility] [address], [place]  (336) 288-8857   °[facility] [address] [place] [address], [place] (336) 299-7000   °Veita Bland [address], [place]  ° (336) 373-1557 Only accepts [place] Access Medicaid patients after they have their name applied to their card.  ° °Self-Pay (no insurance) in [place]: ° °Organization         Address  Phone   Notes  °Sickle Cell Patients, [facility] [address], [place] (336) 832-1970   °[facility] [address], [place] (336) 832-4400   °[facility] ° [address], [place] (336) 992-4800   °[facility]/Dr. Osei-Bonsu ° [address] [place] [address], [place] or [address], [place] (336) 841-8500 Phone number for both [place] and [place] locations is the same.  °[facility] [address], [place] (336) 299-0000   °[facility] [address] [place] [address], [place] or [address] (336) 852-7530 °(336) 878-2260   °[facility] [address], [place] (336) 350-1642, phone; (336) 294-5005, fax Sees patients 1st and 3rd Saturday of every month.  Must not qualify for public or private insurance (i.e. Medicaid, Medicare, [place] Health Choice, Veterans' Benefits) • Household income should be no more than 200% of the poverty level •The clinic cannot treat you if you are pregnant or think you are pregnant • Sexually transmitted diseases are not treated at the clinic.  ° ° °Dental Care: °Organization         Address  Phone  Notes  °[place] [facility] [address],  (336) 641-6152 Accepts children up to age 21 who are enrolled in Medicaid or [place] Health Choice; pregnant women with a Medicaid card; and children who have applied for Medicaid or [place] Health Choice, but were declined, whose parents can pay a reduced fee at time of service.  °[place]  [facility] [place]  [address], [place] (336) 641-7733 Accepts children up to age 21 who are enrolled in Medicaid or [place] Health Choice; pregnant women with a Medicaid card; and children who have applied for Medicaid or [place] Health Choice, but were declined, whose parents can pay a reduced fee at time of service.  °[facility] ° [address],  (336) 641-4533 Patients are seen by appointment only. Walk-ins are not accepted. [facility] will see patients 18 years of age and older. °Monday - Tuesday (8am-5pm) °Most Wednesdays (8:30-5pm) °$30 per visit, cash only  °[facility] ° [address], [place] (336) 641-4533 Patients are seen by appointment only. Walk-ins are not accepted. [facility] will see patients 18 years of age and older. °One   Wednesday Evening (Monthly: Volunteer Based).  $30 per visit, cash only  °[facility]  (919) 537-3737 for adults; Children under age 4, call Graduate Pediatric Dentistry at (919) [phone]. Children aged 4-14, please call (919) [phone] to request a pediatric application. ° Dental services are provided in all areas of dental care including fillings, crowns and bridges, complete and partial dentures, implants, gum treatment, root canals, and extractions. Preventive care is also provided. Treatment is provided to both adults and children. °Patients are selected via a lottery and there is often a waiting list. °  °[facility] [address], °[place] ° (336) 763-8833 [email or web address] °  °[facility] [address], [place], [place] (336)723-1848, Ext. 123 Second and Fourth Thursday of each month, opens at 6:30 AM; Clinic ends at 9 AM.  Patients are seen on a first-come first-served basis, and a limited number are seen during each clinic.  ° °[facility] ° [address], [place], [place] (336) 723-7904    Eligibility Requirements °You must have lived in [place], [place], or [place] counties for at least the last three months. °  You cannot be eligible for state or federal sponsored healthcare insurance, including Veterans Administration, Medicaid, or Medicare. °  You generally cannot be eligible for healthcare insurance through your employer.  °  How to apply: °Eligibility screenings are held every Tuesday and Wednesday afternoon from 1:00 pm until 4:00 pm. You do not need an appointment for the interview!  °[facility] [address], [place], [place] 336-631-2330   °[facility]  336-342-8273   °[facility]  336-703-3100   °[facility]  336-570-6415   ° °Behavioral Health Resources in the Community: °Intensive Outpatient Programs °Organization         Address  Phone  Notes  °[facility] [address], [place], [place] 336-878-6098   °[facility] [address], [place], [place] 336-832-9800   °[facility] [address], [place], [place] ° 336-882-2125   °[facility] [address],  °[place], [place] 1-800-853-5163 or 336-641-4981   °Substance Abuse Resources °Organization         Address  Phone  Notes  °[facility]  336-882-2125   °[facility]  336-784-9470   °[facility]  336-285-9073   °[facility]  336-845-3988   °Residential & Outpatient Substance Abuse Program  1-800-659-3381   °Psychological Services °Organization         Address  Phone  Notes  °[facility]  336- 832-9600   °[facility]  336- 378-7881   °[facility] [address], [place] 1-800-853-5163 or 336-641-4981   ° °Mobile Crisis Teams °Organization         Address  Phone  Notes  °[facility], Mobile Crisis Care Unit  1-877-626-1772   °Assertive °[facility] ° [address]  [place], [place] 336-834-9664   °Sharon DeEsch [address] °[place] 336-554-5454   ° °Self-Help/Support Groups °Organization         Address  Phone             Notes  °[facility] [place] - variety of support groups  336- 373-1402 Call for more information  °Narcotics Anonymous (NA), [facility] [address], °[place]   2 meetings at this location  ° °  Residential Treatment Programs Organization         Address  Phone  Notes  [facility] [address]    [place]  1-534-620-1142   [facility]  [address], [place] [address], [place], [place]   [place], [place] [phone] Admissions: 8am-3pm M-F  [facility] [place] [address],    [place], [place] 778-242-3536   [facility] [address] [place], [place], [place]   [facility] [address],  [place], [place]   [facility] - Intensive Outpatient [address], [place], [place]   [facility]  [place], [place] 1-864-378-3349 or [phone]   [facility] [address] [place], [place] Accepts Medicaid  [facility] [address]  [place] 1-6261979712 Substance Abuse/Addiction Treatment   [facility] Organization         Address  Phone  Notes  [facility]  (817)360-9698   Domenic Schwab, PhD [address] [place], [place]   2362447684 or 315-660-9608   [place], [place] 289-095-4257   [facility] [address], [place], [place] [phone] Insurance/Medicaid/sponsorship through [facility] and Families [address]                                    [place], [place] [phone] [place] [address], [place] 352-374-2714    Dr. Adele Schilder  ([phone])[phone]   [facility] Dept. 1) [address], [place] 2) [place] 3)  [place] [address], [place] 910-291-4315 820-272-6930  (364)[phone]   [place] (408)833-7248 or 684-131-5365 (After Hours)      Take your usual prescriptions as previously directed.  Call your regular medical doctor and mental health provider tomorrow to schedule a follow up appointment this week.  Return to the Emergency Department immediately sooner if worsening.

## 2015-07-29 NOTE — ED Notes (Signed)
Patient allowed second phone call of the day

## 2015-07-29 NOTE — ED Notes (Signed)
Pt used telephone for 5 minutes.

## 2015-07-29 NOTE — ED Notes (Signed)
Pt ambulated to BR twice this am.  Pt cooperative and pleasant.

## 2015-07-29 NOTE — ED Notes (Signed)
Spoke with mental health, patient is cleared to go home from their point of view. They spoke with both the patients fiance and the patients daughter. Will fax mental health resources over.

## 2015-07-30 DIAGNOSIS — F250 Schizoaffective disorder, bipolar type: Secondary | ICD-10-CM | POA: Diagnosis not present

## 2015-08-05 DIAGNOSIS — F17200 Nicotine dependence, unspecified, uncomplicated: Secondary | ICD-10-CM | POA: Diagnosis not present

## 2015-08-05 DIAGNOSIS — F200 Paranoid schizophrenia: Secondary | ICD-10-CM | POA: Diagnosis not present

## 2015-08-05 DIAGNOSIS — Z760 Encounter for issue of repeat prescription: Secondary | ICD-10-CM | POA: Diagnosis not present

## 2015-08-05 DIAGNOSIS — G2581 Restless legs syndrome: Secondary | ICD-10-CM | POA: Diagnosis not present

## 2015-08-05 DIAGNOSIS — R451 Restlessness and agitation: Secondary | ICD-10-CM | POA: Diagnosis not present

## 2015-08-05 DIAGNOSIS — Z79899 Other long term (current) drug therapy: Secondary | ICD-10-CM | POA: Diagnosis not present

## 2015-08-12 ENCOUNTER — Encounter (HOSPITAL_COMMUNITY): Payer: Self-pay | Admitting: Emergency Medicine

## 2015-08-12 ENCOUNTER — Emergency Department (HOSPITAL_COMMUNITY)
Admission: EM | Admit: 2015-08-12 | Discharge: 2015-08-13 | Disposition: A | Discharge status: Other Acute Inpatient Hospital | Payer: Medicare Other | Attending: Emergency Medicine | Admitting: Emergency Medicine

## 2015-08-12 DIAGNOSIS — Z720 Tobacco use: Secondary | ICD-10-CM | POA: Diagnosis not present

## 2015-08-12 DIAGNOSIS — Z8659 Personal history of other mental and behavioral disorders: Secondary | ICD-10-CM | POA: Diagnosis not present

## 2015-08-12 DIAGNOSIS — F259 Schizoaffective disorder, unspecified: Secondary | ICD-10-CM | POA: Diagnosis not present

## 2015-08-12 DIAGNOSIS — Z880 Allergy status to penicillin: Secondary | ICD-10-CM | POA: Insufficient documentation

## 2015-08-12 DIAGNOSIS — F29 Unspecified psychosis not due to a substance or known physiological condition: Secondary | ICD-10-CM | POA: Diagnosis not present

## 2015-08-12 DIAGNOSIS — F17200 Nicotine dependence, unspecified, uncomplicated: Secondary | ICD-10-CM | POA: Diagnosis not present

## 2015-08-12 DIAGNOSIS — R451 Restlessness and agitation: Secondary | ICD-10-CM | POA: Diagnosis not present

## 2015-08-12 DIAGNOSIS — F1510 Other stimulant abuse, uncomplicated: Secondary | ICD-10-CM | POA: Diagnosis not present

## 2015-08-12 DIAGNOSIS — Z8619 Personal history of other infectious and parasitic diseases: Secondary | ICD-10-CM | POA: Insufficient documentation

## 2015-08-12 DIAGNOSIS — Z79899 Other long term (current) drug therapy: Secondary | ICD-10-CM | POA: Diagnosis not present

## 2015-08-12 DIAGNOSIS — R45850 Homicidal ideations: Secondary | ICD-10-CM

## 2015-08-12 DIAGNOSIS — F1210 Cannabis abuse, uncomplicated: Secondary | ICD-10-CM | POA: Insufficient documentation

## 2015-08-12 DIAGNOSIS — F419 Anxiety disorder, unspecified: Secondary | ICD-10-CM | POA: Diagnosis not present

## 2015-08-12 DIAGNOSIS — F319 Bipolar disorder, unspecified: Secondary | ICD-10-CM | POA: Diagnosis not present

## 2015-08-12 DIAGNOSIS — Z760 Encounter for issue of repeat prescription: Secondary | ICD-10-CM | POA: Diagnosis not present

## 2015-08-12 DIAGNOSIS — F430 Acute stress reaction: Secondary | ICD-10-CM | POA: Diagnosis not present

## 2015-08-12 DIAGNOSIS — F309 Manic episode, unspecified: Secondary | ICD-10-CM | POA: Insufficient documentation

## 2015-08-12 DIAGNOSIS — Z008 Encounter for other general examination: Secondary | ICD-10-CM | POA: Diagnosis present

## 2015-08-12 DIAGNOSIS — F3010 Manic episode without psychotic symptoms, unspecified: Secondary | ICD-10-CM

## 2015-08-12 MED ORDER — ZIPRASIDONE MESYLATE 20 MG IM SOLR
20.0000 mg | Freq: Once | INTRAMUSCULAR | Status: AC
  Administered 2015-08-13: 20 mg via INTRAMUSCULAR
  Filled 2015-08-12: qty 20

## 2015-08-12 NOTE — ED Notes (Signed)
Patient stating he is going to buy several companies out. When asked if he was having suicidal or homicidal thoughts patient denied, and stated "I would like to teach people some things though because I am smarter than all of the doctors in this hospital."

## 2015-08-12 NOTE — ED Notes (Signed)
Patient states, "I need my bipolar medication turned up 25 cc of Haldol." Patient reports just left [facility] hospital and stated he was screaming in agony. Patient states he is going to buy [place] and fire everyone. Patient states, "I have been committed 14 times and I need help and medication." Patient denies homicidal or suicidal ideation. Patient states, "I want to teach people some stuff really bad."

## 2015-08-13 ENCOUNTER — Encounter (HOSPITAL_COMMUNITY): Payer: Self-pay

## 2015-08-13 ENCOUNTER — Inpatient Hospital Stay (HOSPITAL_COMMUNITY)
Admission: AD | Admit: 2015-08-13 | Discharge: 2015-08-20 | DRG: 885 | Disposition: A | Discharge status: Home / Self Care | Payer: Medicare Other | Attending: Psychiatry | Admitting: Psychiatry

## 2015-08-13 DIAGNOSIS — F1220 Cannabis dependence, uncomplicated: Secondary | ICD-10-CM | POA: Diagnosis present

## 2015-08-13 DIAGNOSIS — F1599 Other stimulant use, unspecified with unspecified stimulant-induced disorder: Secondary | ICD-10-CM | POA: Diagnosis present

## 2015-08-13 DIAGNOSIS — F17210 Nicotine dependence, cigarettes, uncomplicated: Secondary | ICD-10-CM | POA: Diagnosis present

## 2015-08-13 DIAGNOSIS — F250 Schizoaffective disorder, bipolar type: Secondary | ICD-10-CM | POA: Diagnosis not present

## 2015-08-13 DIAGNOSIS — F29 Unspecified psychosis not due to a substance or known physiological condition: Secondary | ICD-10-CM | POA: Diagnosis present

## 2015-08-13 DIAGNOSIS — F1590 Other stimulant use, unspecified, uncomplicated: Secondary | ICD-10-CM | POA: Diagnosis present

## 2015-08-13 LAB — COMPREHENSIVE METABOLIC PANEL
ALT: 19 U/L (ref 17–63)
AST: 19 U/L (ref 15–41)
Albumin: 4.6 g/dL (ref 3.5–5.0)
Alkaline Phosphatase: 68 U/L (ref 38–126)
Anion gap: 9 (ref 5–15)
BILIRUBIN TOTAL: 0.6 mg/dL (ref 0.3–1.2)
BUN: 13 mg/dL (ref 6–20)
CALCIUM: 9 mg/dL (ref 8.9–10.3)
CO2: 27 mmol/L (ref 22–32)
Chloride: 101 mmol/L (ref 101–111)
Creatinine, Ser: 1.14 mg/dL (ref 0.61–1.24)
GFR calc Af Amer: >60 mL/min (ref >60)
Glucose, Bld: 122 mg/dL — ABNORMAL HIGH (ref 65–99)
POTASSIUM: 3.7 mmol/L (ref 3.5–5.1)
Sodium: 137 mmol/L (ref 135–145)
TOTAL PROTEIN: 7.9 g/dL (ref 6.5–8.1)

## 2015-08-13 LAB — CBC WITH DIFFERENTIAL/PLATELET
Basophils Absolute: 0.1 10*3/uL (ref 0.0–0.1)
Basophils Relative: 1 % (ref 0–1)
EOS PCT: 3 % (ref 0–5)
Eosinophils Absolute: 0.3 10*3/uL (ref 0.0–0.7)
HEMATOCRIT: 43.7 % (ref 39.0–52.0)
HEMOGLOBIN: 15.1 g/dL (ref 13.0–17.0)
LYMPHS PCT: 32 % (ref 12–46)
Lymphs Abs: 3.2 10*3/uL (ref 0.7–4.0)
MCH: 31.5 pg (ref 26.0–34.0)
MCHC: 34.6 g/dL (ref 30.0–36.0)
MCV: 91.2 fL (ref 78.0–100.0)
Monocytes Absolute: 0.8 10*3/uL (ref 0.1–1.0)
Monocytes Relative: 8 % (ref 3–12)
Neutro Abs: 5.6 10*3/uL (ref 1.7–7.7)
Neutrophils Relative %: 56 % (ref 43–77)
Platelets: 336 10*3/uL (ref 150–400)
RBC: 4.79 MIL/uL (ref 4.22–5.81)
RDW: 13.7 % (ref 11.5–15.5)
WBC: 10 10*3/uL (ref 4.0–10.5)

## 2015-08-13 LAB — URINALYSIS, ROUTINE W REFLEX MICROSCOPIC
BILIRUBIN URINE: NEGATIVE
GLUCOSE, UA: NEGATIVE mg/dL
HGB URINE DIPSTICK: NEGATIVE
KETONES UR: NEGATIVE mg/dL
Leukocytes, UA: NEGATIVE
Nitrite: NEGATIVE
PH: 6 (ref 5.0–8.0)
Protein, ur: NEGATIVE mg/dL
Specific Gravity, Urine: <1.005 — ABNORMAL LOW (ref 1.005–1.030)
Urobilinogen, UA: 0.2 mg/dL (ref 0.0–1.0)

## 2015-08-13 LAB — ACETAMINOPHEN LEVEL: Acetaminophen (Tylenol), Serum: <10 ug/mL — ABNORMAL LOW (ref 10–30)

## 2015-08-13 LAB — RAPID URINE DRUG SCREEN, HOSP PERFORMED
AMPHETAMINES: POSITIVE — AB
BARBITURATES: NOT DETECTED
BENZODIAZEPINES: NOT DETECTED
COCAINE: NOT DETECTED
Opiates: NOT DETECTED
TETRAHYDROCANNABINOL: POSITIVE — AB

## 2015-08-13 LAB — SALICYLATE LEVEL: Salicylate Lvl: <4 mg/dL (ref 2.8–30.0)

## 2015-08-13 LAB — ETHANOL: Alcohol, Ethyl (B): <5 mg/dL (ref <5)

## 2015-08-13 MED ORDER — TRAZODONE HCL 50 MG PO TABS
50.0000 mg | ORAL_TABLET | Freq: Every evening | ORAL | Status: DC | PRN
  Administered 2015-08-17 – 2015-08-19 (×3): 50 mg via ORAL
  Filled 2015-08-13 (×5): qty 1

## 2015-08-13 MED ORDER — NICOTINE 21 MG/24HR TD PT24
21.0000 mg | MEDICATED_PATCH | Freq: Every day | TRANSDERMAL | Status: DC
  Filled 2015-08-13: qty 1

## 2015-08-13 MED ORDER — STERILE WATER FOR INJECTION IJ SOLN
INTRAMUSCULAR | Status: AC
  Filled 2015-08-13: qty 10

## 2015-08-13 MED ORDER — GABAPENTIN 300 MG PO CAPS
300.0000 mg | ORAL_CAPSULE | Freq: Four times a day (QID) | ORAL | Status: DC
  Administered 2015-08-13 (×2): 300 mg via ORAL
  Filled 2015-08-13 (×2): qty 1

## 2015-08-13 MED ORDER — TRAZODONE HCL 50 MG PO TABS: 50.0000 mg | ORAL_TABLET | Freq: Every evening | ORAL | Status: DC | PRN

## 2015-08-13 MED ORDER — ZOLPIDEM TARTRATE 5 MG PO TABS: 10.0000 mg | ORAL_TABLET | Freq: Every evening | ORAL | Status: DC | PRN

## 2015-08-13 MED ORDER — HYDROXYZINE HCL 25 MG PO TABS
25.0000 mg | ORAL_TABLET | Freq: Four times a day (QID) | ORAL | Status: DC | PRN
  Administered 2015-08-13: 25 mg via ORAL
  Filled 2015-08-13: qty 1

## 2015-08-13 MED ORDER — MAGNESIUM HYDROXIDE 400 MG/5ML PO SUSP
30.0000 mL | Freq: Every day | ORAL | Status: DC | PRN
Start: 2015-08-13 — End: 2015-08-20

## 2015-08-13 MED ORDER — ACETAMINOPHEN 325 MG PO TABS: 650.0000 mg | ORAL_TABLET | ORAL | Status: DC | PRN

## 2015-08-13 MED ORDER — LORAZEPAM 1 MG PO TABS
1.0000 mg | ORAL_TABLET | Freq: Three times a day (TID) | ORAL | Status: DC | PRN
  Administered 2015-08-13: 1 mg via ORAL
  Filled 2015-08-13: qty 1

## 2015-08-13 MED ORDER — ALUM & MAG HYDROXIDE-SIMETH 200-200-20 MG/5ML PO SUSP
30.0000 mL | ORAL | Status: DC | PRN
Start: 2015-08-13 — End: 2015-08-20

## 2015-08-13 MED ORDER — ALUM & MAG HYDROXIDE-SIMETH 200-200-20 MG/5ML PO SUSP: 30.0000 mL | ORAL | Status: DC | PRN

## 2015-08-13 MED ORDER — ACETAMINOPHEN 325 MG PO TABS: 650.0000 mg | ORAL_TABLET | Freq: Four times a day (QID) | ORAL | Status: DC | PRN

## 2015-08-13 MED ORDER — ONDANSETRON HCL 4 MG PO TABS: 4.0000 mg | ORAL_TABLET | Freq: Three times a day (TID) | ORAL | Status: DC | PRN

## 2015-08-13 MED ORDER — HALOPERIDOL 5 MG PO TABS: 10.0000 mg | ORAL_TABLET | Freq: Four times a day (QID) | ORAL | Status: DC | PRN

## 2015-08-13 MED ORDER — GABAPENTIN 300 MG PO CAPS
300.0000 mg | ORAL_CAPSULE | Freq: Four times a day (QID) | ORAL | Status: DC
  Administered 2015-08-13 – 2015-08-20 (×27): 300 mg via ORAL
  Filled 2015-08-13 (×34): qty 1

## 2015-08-13 MED ORDER — IBUPROFEN 400 MG PO TABS: 600.0000 mg | ORAL_TABLET | Freq: Three times a day (TID) | ORAL | Status: DC | PRN

## 2015-08-13 MED ORDER — NICOTINE POLACRILEX 2 MG MT GUM
2.0000 mg | CHEWING_GUM | OROMUCOSAL | Status: DC | PRN
  Administered 2015-08-13 – 2015-08-20 (×11): 2 mg via ORAL
  Filled 2015-08-13 (×9): qty 1

## 2015-08-13 MED ORDER — NICOTINE 21 MG/24HR TD PT24: 21.0000 mg | MEDICATED_PATCH | Freq: Every day | TRANSDERMAL | Status: DC

## 2015-08-13 NOTE — Progress Notes (Signed)
Admission Note: Pt presented [facility] from [facility] involuntarily. 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 Pt denied having suicidal/homicidal thoughts. Pt was agitated during admission and demanded to be released to go home stating that he does not belong here. Pt denied the use of alcohol, but stated that he smokes marijuana. Pt denied pain and any physical problems. Vital sign WNL, denied as SI and verbally contracted for safety. Pt skin assessed, has one tattoo at the upper back and on both arms and legs, no skin break down. No complains at this time, will continue to monitor.

## 2015-08-13 NOTE — Progress Notes (Signed)
Pt accepted to [facility] bed 505-1 by Dr. Shea Evans per Otila Kluver, [facility]. Can arrive at 1pm. Pt under IVC.  Spoke with [facility] re: pt's placement.   Sharren Bridge, MSW, LCSW Clinical Social Work, Disposition  08/13/2015 [phone]

## 2015-08-13 NOTE — Progress Notes (Signed)
Pt did not attend the evening group and has been angry on approach. Pt twice said in the hallway that if he is not discharged tomorrow he will "start snappin' necks."

## 2015-08-13 NOTE — ED Notes (Signed)
edp at bedside per pt request.  Patient stated he could not be admitted due to running for [facility] this weekend. Patient having flight of ideas.

## 2015-08-13 NOTE — ED Notes (Signed)
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

## 2015-08-13 NOTE — ED Notes (Addendum)
Pt's fiance informed visiting hours are over, she in with pt at this time saying goodbyes; fiance took 2 bags of pt's belongings with her

## 2015-08-13 NOTE — ED Provider Notes (Signed)
CSN: [id number]     Arrival date & time 08/12/15  2304 History   First MD Initiated Contact with Patient 08/12/15 2313     Chief Complaint  Patient presents with  . V70.1     (Consider location/radiation/quality/duration/timing/severity/associated sxs/prior Treatment) HPI  Patient presents to the emergency department with his fiance. She reports over the past 2 months he is getting progressively worse. She states he's pacing all the time, preaching religion, and not sleeping. He is talking about demons. He is taking her to visit churches as many as 3 in one day. They have been asked to leave several churches due to his preaching. Patient reports he was at [facility] ED tonight and he was "called a drug addict". States he is going to buy the hospital and fire everybody. 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 He also states that he is upset there is no cross walks to the [place] in [place]. He states he is picketing and opposing them tearing down [employer] in [place] which is being torn down for reconstruction. Patient also states he is the [employer] of the "[employer]", not the [employer] and he is "undercover". He states they are everywhere. He states that [place] should be flying doctors in instead of flying patients out. He states "you will see my [employer] on Friday". He does admit to taking Adderall off the street. But states he prefers Vyvanse  but  his psychiatrist will not give it to him. He also states he wants Ativan and Vyvanse and his psychiatrist will not give it to him. Patient admits to hearing voices that are "angels". He also talks about "[place]" and states it is short for "may I cook hot dogs home"   Psych Dr Hoyle Barr at [facility]  Past Medical History  Diagnosis Date  . Bipolar 1 disorder   . Schizoaffective disorder   . Chicken pox    Past Surgical History  Procedure Laterality Date  . Lung surgery    . Tonsillectomy    . Myringotomy     History reviewed. No pertinent family  history. Social History  Substance Use Topics  . Smoking status: Current Every Day Smoker    Types: Cigarettes  . Smokeless tobacco: None  . Alcohol Use: Yes     Comment: 3-4x a week beer  on disability Uses adderall of friends Smokes "alot of week"  Review of Systems  Unable to perform ROS: Psychiatric disorder      Allergies  Coconut fatty acids; Onion; and Penicillins  Home Medications   Prior to Admission medications   Medication Sig Start Date End Date Taking? Authorizing Provider  gabapentin (NEURONTIN) 300 MG capsule Take 300 mg by mouth 4 (four) times daily.    Historical Provider, MD  haloperidol (HALDOL) 10 MG tablet Take 10 mg by mouth daily as needed (hallucinations).     Historical Provider, MD  haloperidol decanoate (HALDOL DECANOATE) 100 MG/ML injection Inject 50 mg into the muscle every 28 (twenty-eight) days.      Historical Provider, MD  traZODone (DESYREL) 50 MG tablet Take 50 mg by mouth at bedtime as needed for sleep.     Historical Provider, MD   BP 154/111 mmHg  Pulse 115  Temp(Src) 98.2 F (36.8 C) (Oral)  Resp 20  Ht 6' (1.829 m)  Wt 166 lb (75.297 kg)  BMI 22.51 kg/m2  SpO2 100%  Vital signs normal except tachycardia and hypertension  Physical Exam  Constitutional: He is oriented to person,  place, and time. He appears well-developed and well-nourished.  Non-toxic appearance. He does not appear ill. No distress.  HENT:  Head: Normocephalic and atraumatic.  Right Ear: External ear normal.  Left Ear: External ear normal.  Nose: Nose normal. No mucosal edema or rhinorrhea.  Mouth/Throat: Oropharynx is clear and moist and mucous membranes are normal. No dental abscesses or uvula swelling.  Eyes: Conjunctivae and EOM are normal. Pupils are equal, round, and reactive to light.  Neck: Normal range of motion and full passive range of motion without pain. Neck supple.  Cardiovascular: Normal rate, regular rhythm and normal heart sounds.  Exam  reveals no gallop and no friction rub.   No murmur heard. Pulmonary/Chest: Effort normal and breath sounds normal. No respiratory distress. He has no wheezes. He has no rhonchi. He has no rales. He exhibits no tenderness and no crepitus.  Abdominal: Soft. Normal appearance and bowel sounds are normal. He exhibits no distension. There is no tenderness. There is no rebound and no guarding.  Musculoskeletal: Normal range of motion. He exhibits no edema or tenderness.  Moves all extremities well.   Neurological: He is alert and oriented to person, place, and time. He has normal strength. No cranial nerve deficit.  Skin: Skin is warm, dry and intact. No rash noted. No erythema. No pallor.  Psychiatric: His mood appears anxious. His affect is angry and labile. His speech is rapid and/or pressured. He is agitated and aggressive. Thought content is delusional. He expresses impulsivity and inappropriate judgment. He expresses homicidal ideation.  Nursing note and vitals reviewed.   ED Course  Procedures (including critical care time)  Medications  sterile water (preservative free) injection (not administered)  LORazepam (ATIVAN) tablet 1 mg (not administered)  acetaminophen (TYLENOL) tablet 650 mg (not administered)  ibuprofen (ADVIL,MOTRIN) tablet 600 mg (not administered)  zolpidem (AMBIEN) tablet 10 mg (not administered)  nicotine (NICODERM CQ - dosed in mg/24 hours) patch 21 mg (not administered)  ondansetron (ZOFRAN) tablet 4 mg (not administered)  alum & mag hydroxide-simeth (MAALOX/MYLANTA) 200-200-20 MG/5ML suspension 30 mL (not administered)  gabapentin (NEURONTIN) capsule 300 mg (not administered)  traZODone (DESYREL) tablet 50 mg (not administered)  haloperidol (HALDOL) tablet 10 mg (not administered)  ziprasidone (GEODON) injection 20 mg (20 mg Intramuscular Given 08/13/15 0052)   Patient is extremely agitated and loud, argumentative with police and security. He was given Geodon  IM.  IVC papers were filled out by me after my interview with patient and his fiance.  Sheriff's deputy states patient had made threats to the sheriff on face book that  he was going to plant the [place] next to another man who is deceased.   02:09 Ford, TSS called to discuss reason for consult  02:55 Marijean Bravo, TSS states patient meets inpatient criteria, no beds at this time, but expect some discharges this morning.   03;07 Discussed patient's meds with NP feels current meds are okay. Can increase trazodone to 100 mg if he isn't sleeping.   Labs Review Results for orders placed or performed during the hospital encounter of 08/12/15  Comprehensive metabolic panel  Result Value Ref Range   Sodium 137 135 - 145 mmol/L   Potassium 3.7 3.5 - 5.1 mmol/L   Chloride 101 101 - 111 mmol/L   CO2 27 22 - 32 mmol/L   Glucose, Bld 122 (H) 65 - 99 mg/dL   BUN 13 6 - 20 mg/dL   Creatinine, Ser 1.14 0.61 - 1.24 mg/dL   Calcium 9.0  8.9 - 10.3 mg/dL   Total Protein 7.9 6.5 - 8.1 g/dL   Albumin 4.6 3.5 - 5.0 g/dL   AST 19 15 - 41 U/L   ALT 19 17 - 63 U/L   Alkaline Phosphatase 68 38 - 126 U/L   Total Bilirubin 0.6 0.3 - 1.2 mg/dL   GFR calc non Af Amer >60 >60 mL/min   GFR calc Af Amer >60 >60 mL/min   Anion gap 9 5 - 15  CBC with Differential  Result Value Ref Range   WBC 10.0 4.0 - 10.5 K/uL   RBC 4.79 4.22 - 5.81 MIL/uL   Hemoglobin 15.1 13.0 - 17.0 g/dL   HCT 43.7 39.0 - 52.0 %   MCV 91.2 78.0 - 100.0 fL   MCH 31.5 26.0 - 34.0 pg   MCHC 34.6 30.0 - 36.0 g/dL   RDW 13.7 11.5 - 15.5 %   Platelets 336 150 - 400 K/uL   Neutrophils Relative % 56 43 - 77 %   Neutro Abs 5.6 1.7 - 7.7 K/uL   Lymphocytes Relative 32 12 - 46 %   Lymphs Abs 3.2 0.7 - 4.0 K/uL   Monocytes Relative 8 3 - 12 %   Monocytes Absolute 0.8 0.1 - 1.0 K/uL   Eosinophils Relative 3 0 - 5 %   Eosinophils Absolute 0.3 0.0 - 0.7 K/uL   Basophils Relative 1 0 - 1 %   Basophils Absolute 0.1 0.0 - 0.1 K/uL  Ethanol  Result  Value Ref Range   Alcohol, Ethyl (B) <5 <5 mg/dL  Acetaminophen level  Result Value Ref Range   Acetaminophen (Tylenol), Serum <10 (L) 10 - 30 ug/mL  Salicylate level  Result Value Ref Range   Salicylate Lvl <8.8 2.8 - 30.0 mg/dL  Urine rapid drug screen (hosp performed)  Result Value Ref Range   Opiates NONE DETECTED NONE DETECTED   Cocaine NONE DETECTED NONE DETECTED   Benzodiazepines NONE DETECTED NONE DETECTED   Amphetamines POSITIVE (A) NONE DETECTED   Tetrahydrocannabinol POSITIVE (A) NONE DETECTED   Barbiturates NONE DETECTED NONE DETECTED  Urinalysis, Routine w reflex microscopic  Result Value Ref Range   Color, Urine STRAW (A) YELLOW   APPearance CLEAR CLEAR   Specific Gravity, Urine <1.005 (L) 1.005 - 1.030   pH 6.0 5.0 - 8.0   Glucose, UA NEGATIVE NEGATIVE mg/dL   Hgb urine dipstick NEGATIVE NEGATIVE   Bilirubin Urine NEGATIVE NEGATIVE   Ketones, ur NEGATIVE NEGATIVE mg/dL   Protein, ur NEGATIVE NEGATIVE mg/dL   Urobilinogen, UA 0.2 0.0 - 1.0 mg/dL   Nitrite NEGATIVE NEGATIVE   Leukocytes, UA NEGATIVE NEGATIVE   Laboratory interpretation all normal except + UDS     Imaging Review No results found. I have personally reviewed and evaluated these images and lab results as part of my medical decision-making.   EKG Interpretation None      MDM   Final diagnoses:  Psychosis, unspecified psychosis type  Manic behavior  Homicidal ideation  Amphetamine abuse    Disposition inpatient psychiatric admission  Rolland Porter, MD, Barbette Or, MD 08/13/15 [phone]

## 2015-08-13 NOTE — Progress Notes (Signed)
Patient ID: Blake Hardin, male   DOB: 1970/12/09, 44 y.o.   MRN: [id number] D: Pt in hallway, talking to RN on approach. Appears angry and anxious. Hyper-religious in conversation. No Acute distress noted. A: States he does not feel like he needs to be here. He feels like staff here doesn't know what they are doing and the MDS are stupid for changing his meds. Demanded Ativan from Administrator, Civil Service reinforced orders available and encouraged pt to take Vistaril. He stated it didn't work and Engineer, water call MD to get him some Ativan. Writer again reinforced the need to take what is ordered before calling the MD. Pt reluctantly agreed. He denied SI/HI/AVH and contraced for safety. He offered no questions or concerns otherwise. R: Safety has been maintained with q15 minute obs. Support and encouragement provided. Will discuss need for additional prns with ocp if needed. Will continue current POC

## 2015-08-13 NOTE — ED Notes (Signed)
Pt speaking with Marijean Bravo from [facility]

## 2015-08-13 NOTE — BH Assessment (Signed)
Received notification of TTS consult request. Spoke to Dr. Rolland Porter who said Pt is very psychotic and is abusing amphetamines. Tele-assessment will be initiated.  Blake Hardin, Blake Hardin, [facility], [facility] Triage Specialist [phone]

## 2015-08-13 NOTE — ED Notes (Signed)
Pt has left with law enforcement in hand cuffs, pt with steady gait, pt continues to talk about how the police are out to get him and that they will kill him, continues to make several religious references as well

## 2015-08-13 NOTE — ED Notes (Signed)
Per Megean at [facility] pt has been accepted . Bed 505 - 1, Dr. Shea Evans . Pt may arrive at 1300

## 2015-08-13 NOTE — Tx Team (Addendum)
Initial Interdisciplinary Treatment Plan   PATIENT STRESSORS: Financial difficulties Substance abuse   PATIENT STRENGTHS: Religious Affiliation Supportive family/friends   PROBLEM LIST: Problem List/Patient Goals Date to be addressed Date deferred Reason deferred Estimated date of resolution  "to get out of hear"  08/13/15    "To talk to the doctor"  08/13/15    Psychosis      SI      Depression      Substance abuse                         DISCHARGE CRITERIA:  Improved stabilization in mood, thinking, and/or behavior Reduction of life-threatening or endangering symptoms to within safe limits  PRELIMINARY DISCHARGE PLAN: Participate in family therapy Return to previous living arrangement  PATIENT/FAMIILY INVOLVEMENT: This treatment plan has been presented to and reviewed with the patient, Blake Hardin, and/or family member.The patient and family have been given the opportunity to ask questions and make suggestions.  Wolfgang Phoenix 08/13/2015, 5:23 PM

## 2015-08-13 NOTE — ED Notes (Signed)
Per conference call with [facility], pt will possibly be transferred this morning for inpatient.

## 2015-08-13 NOTE — ED Notes (Signed)
Pt's fiance informed this nurse that pt has been in this manic episode for the last couple of months, she said before this pt had been sleeping a lot and was calm
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
## 2015-08-14 ENCOUNTER — Encounter (HOSPITAL_COMMUNITY): Payer: Self-pay | Admitting: Psychiatry

## 2015-08-14 DIAGNOSIS — F1590 Other stimulant use, unspecified, uncomplicated: Secondary | ICD-10-CM | POA: Diagnosis present

## 2015-08-14 DIAGNOSIS — F250 Schizoaffective disorder, bipolar type: Principal | ICD-10-CM

## 2015-08-14 DIAGNOSIS — F1599 Other stimulant use, unspecified with unspecified stimulant-induced disorder: Secondary | ICD-10-CM

## 2015-08-14 DIAGNOSIS — F1220 Cannabis dependence, uncomplicated: Secondary | ICD-10-CM

## 2015-08-14 LAB — LIPID PANEL
Cholesterol: 172 mg/dL (ref 0–200)
HDL: 33 mg/dL — ABNORMAL LOW (ref >40)
LDL CALC: 85 mg/dL (ref 0–99)
TRIGLYCERIDES: 271 mg/dL — AB (ref <150)
Total CHOL/HDL Ratio: 5.2 RATIO
VLDL: 54 mg/dL — ABNORMAL HIGH (ref 0–40)

## 2015-08-14 MED ORDER — ARIPIPRAZOLE 5 MG PO TABS
5.0000 mg | ORAL_TABLET | Freq: Two times a day (BID) | ORAL | Status: DC
  Administered 2015-08-14 – 2015-08-15 (×2): 5 mg via ORAL
  Filled 2015-08-14 (×4): qty 1

## 2015-08-14 MED ORDER — OLANZAPINE 10 MG PO TABS
10.0000 mg | ORAL_TABLET | Freq: Three times a day (TID) | ORAL | Status: DC | PRN
  Filled 2015-08-14: qty 1

## 2015-08-14 MED ORDER — HYDROXYZINE HCL 25 MG PO TABS
25.0000 mg | ORAL_TABLET | Freq: Four times a day (QID) | ORAL | Status: DC | PRN
  Administered 2015-08-14 – 2015-08-20 (×10): 25 mg via ORAL
  Filled 2015-08-14 (×10): qty 1

## 2015-08-14 MED ORDER — HYDROCORTISONE 1 % EX CREA
TOPICAL_CREAM | Freq: Two times a day (BID) | CUTANEOUS | Status: DC | PRN
  Filled 2015-08-14: qty 28

## 2015-08-14 MED ORDER — DIVALPROEX SODIUM ER 500 MG PO TB24
750.0000 mg | ORAL_TABLET | Freq: Every day | ORAL | Status: DC
  Administered 2015-08-14 – 2015-08-16 (×3): 750 mg via ORAL
  Filled 2015-08-14 (×4): qty 1

## 2015-08-14 NOTE — Progress Notes (Signed)
The focus of this group is to help patients review their daily goal of treatment and discuss progress on daily workbooks. Pt attended the evening group session but had difficulty responding on-topic to discussion prompts from the [place]. Pt was religiously pre-occupied, telling the Writer that he had learned secrets about the hospital he is presently admitted to by reading the Bible backwards. Miki also presented with pressured speech, but was less agitated than in previous encounters. On the topic of preferred coping skills, Pt mentioned reading the lost books of the bible that were taken out.

## 2015-08-14 NOTE — BHH Suicide Risk Assessment (Signed)
[facility] Admission Suicide Risk Assessment   Nursing information obtained from:    Demographic factors:    Current Mental Status:    Loss Factors:    Historical Factors:    Risk Reduction Factors:    Total Time spent with patient: 30 minutes Principal Problem: Schizoaffective disorder, bipolar type Diagnosis:   Patient Active Problem List   Diagnosis Date Noted  . Schizoaffective disorder, bipolar type [F25.0] 08/14/2015  . Cannabis use disorder, severe, dependence [F12.20] 08/14/2015     Continued Clinical Symptoms:  Alcohol Use Disorder Identification Test Final Score (AUDIT): 0 The "Alcohol Use Disorders Identification Test", Guidelines for Use in Primary Care, Second Edition.  World Pharmacologist [facility]). Score between 0-7:  no or low risk or alcohol related problems. Score between 8-15:  moderate risk of alcohol related problems. Score between 16-19:  high risk of alcohol related problems. Score 20 or above:  warrants further diagnostic evaluation for alcohol dependence and treatment.   CLINICAL FACTORS:   Alcohol/Substance Abuse/Dependencies More than one psychiatric diagnosis Currently Psychotic Unstable or Poor Therapeutic Relationship Previous Psychiatric Diagnoses and Treatments   Musculoskeletal: Strength & Muscle Tone: within normal limits Gait & Station: normal Patient leans: N/A  Psychiatric Specialty Exam: Physical Exam  ROS  Blood pressure 113/83, pulse 101, temperature 97.7 F (36.5 C), temperature source Oral, resp. rate 16, height 6' (1.829 m), weight 75.297 kg (166 lb), SpO2 100 %.Body mass index is 22.51 kg/(m^2).                    Please see H&P.                                      COGNITIVE FEATURES THAT CONTRIBUTE TO RISK:  Closed-mindedness, Polarized thinking and Thought constriction (tunnel vision)    SUICIDE RISK:   Moderate:  Frequent suicidal ideation with limited intensity, and duration, some specificity  in terms of plans, no associated intent, good self-control, limited dysphoria/symptomatology, some risk factors present, and identifiable protective factors, including available and accessible social support.  PLAN OF CARE: Please see H&P.   Medical Decision Making:  Review of Psycho-Social Stressors (1), Review or order clinical lab tests (1), Established Problem, Worsening (2), Review of Last Therapy Session (1), Review of Medication Regimen & Side Effects (2) and Review of New Medication or Change in Dosage (2)  I certify that inpatient services furnished can reasonably be expected to improve the patient's condition.   Leslyn Monda MD 08/14/2015, 12:34 PM

## 2015-08-14 NOTE — Tx Team (Signed)
Interdisciplinary Treatment Plan Update (Adult)  Date:  08/14/2015   Time Reviewed:  10:49 AM   Progress in Treatment: Attending groups: Yes. Participating in groups:  Yes. Taking medication as prescribed:  Yes. Tolerating medication:  Yes. Family/Significant othe contact made:  Yes Patient understands diagnosis:  No  Limited insight Discussing patient identified problems/goals with staff:  Yes, see initial care plan. Medical problems stabilized or resolved:  Yes. Denies suicidal/homicidal ideation: Yes. Issues/concerns per patient self-inventory:  No. Other:  New problem(s) identified:  Discharge Plan or Barriers: return home, follow up outpt  Reason for Continuation of Hospitalization: Hallucinations Medication stabilization Other; describe Delusions, irritability  Comments: " I am fine .'  Objective: Blake Hardin is a 44 y.o. White male, single, Caucasian who presented to [facility] ED accompanied by his fiancee for disorganized behavior. Pt per initial notes in EHR - was feeling agitated ,he had initially gone to [facility] , where he was reportedly called a drug addict , and was told to leave . Pt also was preaching a lot since the past several days, was kicked out of three different churches for the same , was having grandiose delusions that he was going to [facility] as well as made threats online about burying the current sheriff next to a deceased man."   Patient seen this AM. Patient continues to be hyperactive , seen as restless and manic on the unit . Pt focussed on getting discharged from the unit, making threats that he will show his true nature if not discharged by tomorrow.  Depakote, Neurontin, Haldol trial  Estimated length of stay: 2-4 days  New goal(s):  Review of initial/current patient goals per problem list:   Review of initial/current patient goals per problem list:  1. Goal(s): Patient will participate in aftercare plan    Met: Yes   Target date: 3-5 days post admission date   As evidenced by: Patient will participate within aftercare plan AEB aftercare provider and housing plan at discharge being identified.  08/14/2015: Plans to return home, follow up outpt    4. Goal(s): Patient will demonstrate decreased signs of withdrawal due to substance abuse   Met: Yes   Target date: 3-5 days post admission date   As evidenced by: Patient will produce a CIWA/COWS score of 0, have stable vitals signs, and no symptoms of withdrawal 08/14/15  No signs nor symptoms of withdrawal today.      5. Goal(s): Patient will demonstrate decreased signs of psychosis  * Met: No  * Target date: 3-5 days post admission date  * As evidenced by: Patient will demonstrate decreased frequency of AVH or return to baseline function 08/16/15  Pt is goal directed and less disorganized, but delusions persist          Attendees: Patient:  08/14/2015 10:49 AM   Family:   08/14/2015 10:49 AM   Physician:  Ursula Alert, MD 08/14/2015 10:49 AM   Nursing:   Gaylan Gerold, RN 08/14/2015 10:49 AM   CSW:    Roque Lias, LCSW   08/14/2015 10:49 AM   Other:  08/14/2015 10:49 AM   Other:   08/14/2015 10:49 AM   Other:  Lars Pinks, Nurse CM 08/14/2015 10:49 AM   Other:  Lucinda Dell, [facility] TCT 08/14/2015 10:49 AM   Other:  Norberto Sorenson, [place]  08/14/2015 10:49 AM   Other:  08/14/2015 10:49 AM   Other:  08/14/2015 10:49 AM   Other:  08/14/2015 10:49  AM   Other:  08/14/2015 10:49 AM   Other:  08/14/2015 10:49 AM   Other:   08/14/2015 10:49 AM    Scribe for Treatment Team:   Trish Mage, 08/14/2015 10:49 AM

## 2015-08-14 NOTE — Progress Notes (Signed)
D: Patient is calm and kept to himself.  Denies pain, auditory and visual hallucinations.  Patient was very hyper religious.  Talked about religion whenever he was up and out of his room.  Spent most of his time in his room.  Became agitated when he missed outside activity because patient was in his bed sleeping.  Patient reports blood in his stool.  Stool occult blood specimen ordered. A: Medication given as prescribed.  Maintained on routine safety checks per protocol. R: Will continue to monitor for safety.

## 2015-08-14 NOTE — Progress Notes (Signed)
NUTRITION ASSESSMENT  Pt identified as at risk on the Malnutrition Screen Tool   Weight Hx: Wt Readings from Last 10 Encounters:  08/13/15 166 lb (75.297 kg)  08/12/15 166 lb (75.297 kg)  07/28/15 158 lb (71.668 kg)  11/13/14 161 lb (73.029 kg)  12/18/11 150 lb (68.04 kg)  12/13/11 151 lb (68.493 kg)  10/24/11 132 lb (59.875 kg)    BMI:  Body mass index is 22.51 kg/(m^2). Pt meets criteria for normal weight  based on current BMI.  Pt seen for MST. Weight hx review indicates weight gain recently. Per notes from last night, pt very angry when approached and has been threatening others if he is not discharged. Did not speak with pt.  Pt is eating as desired.  Lab results and medications reviewed.      Jarome Matin, RD, LDN Inpatient Clinical Dietitian Pager # [phone] After hours/weekend pager # [phone]

## 2015-08-14 NOTE — Progress Notes (Signed)
Patient ID: Blake Hardin, male   DOB: Oct 25, 1971, 44 y.o.   MRN: [id number] PER STATE REGULATIONS 482.30  THIS CHART WAS REVIEWED FOR MEDICAL NECESSITY WITH RESPECT TO THE PATIENT'S ADMISSION/DURATION OF STAY.  NEXT REVIEW DATE:08/17/15  Roma Schanz, RN, BSN CASE MANAGER

## 2015-08-14 NOTE — Progress Notes (Signed)
D: Pt denies SI/HI/AVH. Pt is very argumentative and controversial. Pt very hyper religious and in denial about his situation.  Pt stated there was no reason for him being here, then he said it was for a med adjustment.   A: Pt was offered support and encouragement. Pt was given scheduled medications. Pt was encourage to attend groups. Q 15 minute checks were done for safety.   R:Pt attends groups and interacts well with peers and staff. Pt is taking medication. Pt receptive to treatment and safety maintained on unit.

## 2015-08-14 NOTE — BHH Group Notes (Signed)
[place] LCSW Group Therapy  08/14/2015 2:56 PM   Type of Therapy:  Group Therapy  Participation Level:  Invited.  Chose to not attend  Summary of Progress/Problems: Today's group focused on relapse prevention.  We defined the term, and then brainstormed on ways to prevent relapse.  Roque Lias B 08/14/2015 , 2:56 PM

## 2015-08-14 NOTE — Progress Notes (Signed)
Patient notified of specimen collection for occult blood.  Patient unable at this time but is aware.

## 2015-08-14 NOTE — H&P (Addendum)
Psychiatric Admission Assessment Adult  Patient Identification: Blake Hardin MRN:  [id number] Date of Evaluation:  08/14/2015 Chief Complaint: Patient states " I have been preaching ."      Principal Diagnosis: Schizoaffective disorder, bipolar type Diagnosis:   Patient Active Problem List   Diagnosis Date Noted  . Schizoaffective disorder, bipolar type [F25.0] 08/14/2015  . Cannabis use disorder, severe, dependence [F12.20] 08/14/2015   History of Present Illness:: Blake Hardin is a 44 y.o. White male, single, Caucasian who presented to [facility] ED accompanied by his fiancee for disorganized behavior. Pt per initial notes in EHR - was feeling agitated ,he had initially gone to [facility] , where he was reportedly called a drug addict , and was told to leave . Pt also was preaching a lot since the past several days, was kicked out of three different churches for the same , was having grandiose delusions that he was [facility] as well as made threats online about burying the current sheriff next to a deceased man."  Patient seen today. Pt today seen as very manic, with pressured speech , religiously preoccupied , talking about God and bible . Pt also with grandiose delusions , states he has 12 penny and he is going to buy the hospital.Pt also states that he wrote a new book on calculus and maths and that the police stole it.  Pt reports sleep issues prior to admission, however reports sleep improved last night while at the hospital. Pt denies any AH/VH . Pt also appears to be having extreme mood lability as well as irritability at times - Hotel manager to send him home tomorrow , or he will show who he really is . Pt reports atleast 15 hospitalizations in the past , was admitted at [facility] from 10/24/2011- 11/08/2011. Pt reports being on Haldol, haldol decanoate in the past. Pt reports being allergic to cogentin - states " I never have side effects to  haldol.' Pt reports abusing marijuana , but does not elaborate . Pt denies abusing any other illicit drugs. Pt denies any suicide attempts .   Elements:  Location:  manic sx. Quality:  see above. Severity:  severe. Timing:  acute. Duration:  past 3 days. Context:  hx of schizoaffective do . Associated Signs/Symptoms: Depression Symptoms:  insomnia, (Hypo) Manic Symptoms:  Delusions, Distractibility, Elevated Mood, Flight of Ideas, Grandiosity, Impulsivity, Irritable Mood, Labiality of Mood, Anxiety Symptoms:  denies  Psychotic Symptoms:  Delusions, PTSD Symptoms: Negative Total Time spent with patient: 1 hour  Past Medical History:  Past Medical History  Diagnosis Date  . Bipolar 1 disorder   . Schizoaffective disorder   . Chicken pox     Past Surgical History  Procedure Laterality Date  . Lung surgery    . Tonsillectomy    . Myringotomy     Family History:  Family History  Problem Relation Age of Onset  . Mental illness Sister   . Mental illness Brother    Social History:  History  Alcohol Use  . Yes    Comment: 3-4x a week beer     History  Drug Use  . Yes  . Special: Methamphetamines, Marijuana    Social History   Social History  . Marital Status: Single    Spouse Name: N/A  . Number of Children: N/A  . Years of Education: N/A   Social History Main Topics  . Smoking status: Current Every Day Smoker  Types: Cigarettes  . Smokeless tobacco: None  . Alcohol Use: Yes     Comment: 3-4x a week beer  . Drug Use: Yes    Special: Methamphetamines, Marijuana  . Sexual Activity: Not Asked   Other Topics Concern  . None   Social History Narrative   Additional Social History:    Pain Medications: Pt denies the abuse Prescriptions: denies Over the Counter: denies History of alcohol / drug use?: Yes Longest period of sobriety (when/how long): unkwon Negative Consequences of Use:  (Pt refused to answer) Withdrawal Symptoms: Other (Comment)  (none observed)            Patient reports that he got his GED. Pt did have legal charges in the past for marijuana possession.          Musculoskeletal: Strength & Muscle Tone: within normal limits Gait & Station: normal Patient leans: N/A  Psychiatric Specialty Exam: Physical Exam  Constitutional:  I concur with PE done in ED.    Review of Systems  Psychiatric/Behavioral: Positive for hallucinations and substance abuse. The patient is nervous/anxious and has insomnia.   All other systems reviewed and are negative.   Blood pressure 113/83, pulse 101, temperature 97.7 F (36.5 C), temperature source Oral, resp. rate 16, height 6' (1.829 m), weight 75.297 kg (166 lb), SpO2 100 %.Body mass index is 22.51 kg/(m^2).  General Appearance: Disheveled  Eye Sport and exercise psychologist::  Fair  Speech:  Pressured  Volume:  Increased  Mood:  Euphoric  Affect:  Labile  Thought Process:  Irrelevant  Orientation:  Full (Time, Place, and Person)  Thought Content:  Delusions and Rumination  Suicidal Thoughts:  No  Homicidal Thoughts:  No  Memory:  Immediate;   Fair Recent;   Fair Remote;   Fair  Judgement:  Impaired  Insight:  Shallow  Psychomotor Activity:  Increased  Concentration:  Poor  Recall:  [employer] of Knowledge:Fair  Language: Fair  Akathisia:  No  Handed:  Right  AIMS (if indicated):     Assets:  Communication Skills  ADL's:  Intact  Cognition: WNL  Sleep:  Number of Hours: 6.75   Risk to Self: Is patient at risk for suicide?: No What has been your use of drugs/alcohol within the last 12 months?: smoking weed, "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 How often do you have a drink containing alcohol?: Never 9. Have you or someone else been injured as a result of your drinking?: No 10. Has a relative or friend or a doctor or  another health worker been concerned about your drinking or suggested you cut down?: No Alcohol Use Disorder Identification Test Final Score (AUDIT): 0 Brief Intervention: AUDIT score less than 7 or less-screening does not suggest unhealthy drinking-brief intervention not indicated (Pt cousled and handed the handout.)  Allergies:   Allergies  Allergen Reactions  . Coconut Fatty Acids   . Onion   . Penicillins Other (See Comments)    Reaction: unknown Childhood allergy   Lab Results:  Results for orders placed or performed during the hospital encounter of 08/13/15 (from the past 48 hour(s))  Lipid panel, fasting     Status: Abnormal   Collection Time: 08/14/15  6:21 AM  Result Value Ref Range   Cholesterol 172 0 - 200 mg/dL   Triglycerides 271 (H) <  150 mg/dL   HDL 33 (L) >40 mg/dL   Total CHOL/HDL Ratio 5.2 RATIO   VLDL 54 (H) 0 - 40 mg/dL   LDL Cholesterol 85 0 - 99 mg/dL    Comment:        Total Cholesterol/HDL:CHD Risk Coronary Heart Disease Risk Table                     Men   Women  1/2 Average Risk   3.4   3.3  Average Risk       5.0   4.4  2 X Average Risk   9.6   7.1  3 X Average Risk  23.4   11.0        Use the calculated Patient Ratio above and the CHD Risk Table to determine the patient's CHD Risk.        ATP III CLASSIFICATION (LDL):  <100     mg/dL   Optimal  100-129  mg/dL   Near or Above                    Optimal  130-159  mg/dL   Borderline  160-189  mg/dL   High  >190     mg/dL   Very High Performed at [facility]    Current Medications: Current Facility-Administered Medications  Medication Dose Route Frequency Provider Last Rate Last Dose  . acetaminophen (TYLENOL) tablet 650 mg  650 mg Oral Q6H PRN Encarnacion Slates, NP      . alum & mag hydroxide-simeth (MAALOX/MYLANTA) 200-200-20 MG/5ML suspension 30 mL  30 mL Oral Q4H PRN Encarnacion Slates, NP      . ARIPiprazole (ABILIFY) tablet 5 mg  5 mg Oral BID Ursula Alert, MD   5 mg at 08/14/15 1205   . divalproex (DEPAKOTE ER) 24 hr tablet 750 mg  750 mg Oral QHS Seena Face, MD      . gabapentin (NEURONTIN) capsule 300 mg  300 mg Oral QID Encarnacion Slates, NP   300 mg at 08/14/15 1206  . hydrOXYzine (ATARAX/VISTARIL) tablet 25 mg  25 mg Oral Q6H PRN Encarnacion Slates, NP   25 mg at 08/13/15 2112  . magnesium hydroxide (MILK OF MAGNESIA) suspension 30 mL  30 mL Oral Daily PRN Encarnacion Slates, NP      . nicotine polacrilex (NICORETTE) gum 2 mg  2 mg Oral PRN Ursula Alert, MD   2 mg at 08/13/15 2112  . traZODone (DESYREL) tablet 50 mg  50 mg Oral QHS PRN Encarnacion Slates, NP       PTA Medications: Prescriptions prior to admission  Medication Sig Dispense Refill Last Dose  . gabapentin (NEURONTIN) 300 MG capsule Take 300 mg by mouth 4 (four) times daily.   07/27/2015 at Unknown time  . gabapentin (NEURONTIN) 800 MG tablet      . haloperidol (HALDOL) 10 MG tablet Take 10 mg by mouth daily as needed (hallucinations).    Past Week at Unknown time  . haloperidol decanoate (HALDOL DECANOATE) 100 MG/ML injection Inject 50 mg into the muscle every 28 (twenty-eight) days.     07/25/2015 at Unknown time  . traZODone (DESYREL) 100 MG tablet      . traZODone (DESYREL) 50 MG tablet Take 50 mg by mouth at bedtime as needed for sleep.    unknown    Previous Psychotropic Medications: Yes , haldol ,haldol dec  Substance Abuse History in the last  12 months:  Yes.  , marijuana - regular abuse, amphetamines - on and off    Consequences of Substance Abuse: Medical Consequences:  several admissions to hospitals Legal Consequences:  charges for marijuana posession  Results for orders placed or performed during the hospital encounter of 08/13/15 (from the past 72 hour(s))  Lipid panel, fasting     Status: Abnormal   Collection Time: 08/14/15  6:21 AM  Result Value Ref Range   Cholesterol 172 0 - 200 mg/dL   Triglycerides 271 (H) <150 mg/dL   HDL 33 (L) >40 mg/dL   Total CHOL/HDL Ratio 5.2 RATIO   VLDL 54 (H)  0 - 40 mg/dL   LDL Cholesterol 85 0 - 99 mg/dL    Comment:        Total Cholesterol/HDL:CHD Risk Coronary Heart Disease Risk Table                     Men   Women  1/2 Average Risk   3.4   3.3  Average Risk       5.0   4.4  2 X Average Risk   9.6   7.1  3 X Average Risk  23.4   11.0        Use the calculated Patient Ratio above and the CHD Risk Table to determine the patient's CHD Risk.        ATP III CLASSIFICATION (LDL):  <100     mg/dL   Optimal  100-129  mg/dL   Near or Above                    Optimal  130-159  mg/dL   Borderline  160-189  mg/dL   High  >190     mg/dL   Very High Performed at [facility]     Observation Level/Precautions:  15 minute checks    Psychotherapy:  Individual and group therapy     Consultations:  Social worker  Discharge Concerns: Stability        Psychological Evaluations: No   Treatment Plan Summary: Daily contact with patient to assess and evaluate symptoms and progress in treatment and Medication management   Patient will benefit from inpatient treatment and stabilization.  Estimated length of stay is 5-7 days.  Reviewed past medical records,treatment plan.  Will start a trial of Abilify 5 mg po bid for psychosis. Will start Depakote ER 750 MG PO QHS for mood sx. Will continue to monitor vitals ,medication compliance and treatment side effects while patient is here.  Will monitor for medical issues as well as call consult as needed.  Reviewed labs cbc , cmp, -wnl, uds- THC pos , stimulant pos, will get EKG  As well as metabolic panel, PL and a depakote level in 3-5 days . CSW will start working on disposition.  Patient to participate in therapeutic milieu .       Medical Decision Making:  Review of Psycho-Social Stressors (1), Review or order clinical lab tests (1), Established Problem, Worsening (2), Review of Medication Regimen & Side Effects (2) and Review of New Medication or Change in Dosage (2)  I certify that  inpatient services furnished can reasonably be expected to improve the patient's condition.   Dorian Duval MD 9/6/201612:35 PM

## 2015-08-14 NOTE — Plan of Care (Signed)
Problem: Alteration in mood Goal: LTG-Patient reports reduction in suicidal thoughts (Patient reports reduction in suicidal thoughts and is able to verbalize a safety plan for whenever patient is feeling suicidal)  Outcome: Progressing Pt denies SI at this time     

## 2015-08-15 LAB — HEMOGLOBIN A1C
HEMOGLOBIN A1C: 5.5 % (ref 4.8–5.6)
MEAN PLASMA GLUCOSE: 111 mg/dL

## 2015-08-15 LAB — LIPID PANEL
CHOL/HDL RATIO: 5 ratio
Cholesterol: 185 mg/dL (ref 0–200)
HDL: 37 mg/dL — AB (ref >40)
LDL CALC: 93 mg/dL (ref 0–99)
Triglycerides: 274 mg/dL — ABNORMAL HIGH (ref <150)
VLDL: 55 mg/dL — AB (ref 0–40)

## 2015-08-15 LAB — OCCULT BLOOD X 1 CARD TO LAB, STOOL: FECAL OCCULT BLD: NEGATIVE

## 2015-08-15 LAB — TSH: TSH: 4.723 u[IU]/mL — AB (ref 0.350–4.500)

## 2015-08-15 MED ORDER — HALOPERIDOL DECANOATE 100 MG/ML IM SOLN: 75.0000 mg | INTRAMUSCULAR | Status: DC

## 2015-08-15 MED ORDER — HALOPERIDOL 5 MG PO TABS: 5.0000 mg | ORAL_TABLET | Freq: Three times a day (TID) | ORAL | Status: DC | PRN

## 2015-08-15 MED ORDER — HALOPERIDOL 5 MG PO TABS
5.0000 mg | ORAL_TABLET | Freq: Every evening | ORAL | Status: DC
  Administered 2015-08-15: 5 mg via ORAL
  Filled 2015-08-15 (×3): qty 1

## 2015-08-15 MED ORDER — BENZTROPINE MESYLATE 0.5 MG PO TABS
0.5000 mg | ORAL_TABLET | Freq: Every evening | ORAL | Status: DC
  Administered 2015-08-15 – 2015-08-16 (×2): 0.5 mg via ORAL
  Filled 2015-08-15 (×3): qty 1

## 2015-08-15 MED ORDER — DIPHENHYDRAMINE HCL 25 MG PO CAPS
25.0000 mg | ORAL_CAPSULE | Freq: Three times a day (TID) | ORAL | Status: DC | PRN
Start: 2015-08-15 — End: 2015-08-20
  Administered 2015-08-15 – 2015-08-19 (×3): 25 mg via ORAL
  Filled 2015-08-15 (×3): qty 1

## 2015-08-15 NOTE — [facility] Group Notes (Signed)
[facility] LCSW Group Therapy  08/15/2015 4:18 PM  Type of Therapy: Group Therapy  Participation Level: Active  Participation Quality: Attentive  Affect: Flat  Cognitive: Oriented  Insight: Limited  Engagement in Therapy: Engaged  Modes of Intervention: Discussion and Socialization  Summary of Progress/Problems: Shanon Brow from the [facility] was here to tell his story of recovery and play his guitar. Pt was pleasant and cooperative during group. Pt was hyper focused on religion and spoke about it frequently with the presenter while presenter was trying to move on to the next topic.  Kara Mead. Marshell Levan 08/15/2015 4:18 PM

## 2015-08-15 NOTE — Plan of Care (Signed)
Problem: Ineffective individual coping Goal: LTG: Patient will report a decrease in negative feelings Outcome: Progressing Blake Hardin has been out and visible on the unit.  He didn't voice any negative feelings.  Encouraged him to seek out staff if needed or if he has any questions.

## 2015-08-15 NOTE — Plan of Care (Signed)
Problem: Ineffective individual coping Goal: STG: Patient will remain free from self harm Outcome: Progressing Pt safe on the unit at this time   Problem: Alteration in mood & ability to function due to Goal: LTG-Pt reports reduction in suicidal thoughts (Patient reports reduction in suicidal thoughts and is able to verbalize a safety plan for whenever patient is feeling suicidal)  Outcome: Progressing Pt denies SI at this time

## 2015-08-15 NOTE — [facility] Group Notes (Signed)
[facility] LCSW Aftercare Discharge Planning Group Note   08/15/2015 11:10 AM  Participation Quality:  Active  Mood/Affect:  Appropriate  Depression Rating:  denies  Anxiety Rating:  denies  Thoughts of Suicide:  No Will you contract for safety?   No  Current AVH:  No  Plan for Discharge/Comments:  Pt as very pleasant in group today and denies all symptoms. Pt stated he is "feeling great" and "has a great support group", citing the fact that his fiance came to visit last night and he also talked to his outpt nurse yesterday. He continues with tangential thought related to nothing, aeb talking about being charged with assault "for throwing water at her and hitting her with the word of God."  Also went on a jag about "more churches per capita in [place] than any where else in the [place], and yet there is not a homeless shelter or a public drinking fountain to be found."  [employer]: fiance  Supports: [place]

## 2015-08-15 NOTE — Progress Notes (Signed)
DAR Note: Blake Hardin has been visible on the unit.  He denies any SI/HI or A/V hallucinations.  He remains religiously preoccupied and talked about writing a book on the bible using mathematics.  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   He. He completed his self inventory and reports 0/10 for depression, hopelessness and anxiety.  He denies any complaints of pain.  He appears to be in no physical distress.  Chaplain called per request.  Hemocult sample obtained and put in for lab to pick up.  Q 15 minute checks maintained for safety.

## 2015-08-15 NOTE — Progress Notes (Signed)
Adult Psychoeducational Group Note  Date:  08/15/2015 Time:  9:20 PM  Group Topic/Focus:  Wrap-Up Group:   The focus of this group is to help patients review their daily goal of treatment and discuss progress on daily workbooks.  Participation Level:  Minimal  Participation Quality:  Sharing  Affect:  Anxious  Cognitive:  Oriented  Insight: Limited  Engagement in Group:  Engaged  Modes of Intervention:  Socialization and Support  Additional Comments:  Patient attended and participated in group tonight. He reports having a good day. He went outside, had a visitor, went for his meals and attended group. The patient reports that he felt he was overdosed on medication today.  Salley Scarlet [facility] 08/15/2015, 9:20 PM

## 2015-08-15 NOTE — Progress Notes (Addendum)
[facility] MD Progress Note  08/15/2015 3:01 PM Blake Hardin  MRN:  [id number] Subjective:  Patient states " I am fine .'  Objective: Sencere Blake Hardin is a 44 y.o. White male, single, Caucasian who presented to [facility] ED accompanied by his fiancee for disorganized behavior. Pt per initial notes in EHR - was feeling agitated ,he had initially gone to [facility] , where he was reportedly called a drug addict , and was told to leave . Pt also was preaching a lot since the past several days, was kicked out of three different churches for the same , was having grandiose delusions that he was going to buy the [facility] as well as made threats online about burying the current sheriff next to a deceased man."   Patient seen this AM. Patient continues to be hyperactive , seen as restless and manic on the unit . Pt focussed on getting discharged from the unit, making threats that he will show his true nature if not discharged by tomorrow. Pt denies any new concerns. Per staff - pt is compliant on medications , is seen as hyperactive , restless, needs redirection.Pt with c/o blood in stool - offered testing for occult blood.    Principal Problem: Schizoaffective disorder, bipolar type Diagnosis:   Patient Active Problem List   Diagnosis Date Noted  . Schizoaffective disorder, bipolar type [F25.0] 08/14/2015  . Cannabis use disorder, severe, dependence [F12.20] 08/14/2015  . Stimulant use disorder [F15.99] 08/14/2015   Total Time spent with patient: 25 minutes   Past Medical History:  Past Medical History  Diagnosis Date  . Bipolar 1 disorder   . Schizoaffective disorder   . Chicken pox     Past Surgical History  Procedure Laterality Date  . Lung surgery    . Tonsillectomy    . Myringotomy     Family History:  Family History  Problem Relation Age of Onset  . Mental illness Sister   . Mental illness Brother    Social History:  History  Alcohol Use  . Yes     Comment: 3-4x a week beer     History  Drug Use  . Yes  . Special: Methamphetamines, Marijuana    Social History   Social History  . Marital Status: Single    Spouse Name: N/A  . Number of Children: N/A  . Years of Education: N/A   Social History Main Topics  . Smoking status: Current Every Day Smoker    Types: Cigarettes  . Smokeless tobacco: None  . Alcohol Use: Yes     Comment: 3-4x a week beer  . Drug Use: Yes    Special: Methamphetamines, Marijuana  . Sexual Activity: Not Asked   Other Topics Concern  . None   Social History Narrative   Additional History:    Sleep: Fair  Appetite:  Fair     Musculoskeletal: Strength & Muscle Tone: within normal limits Gait & Station: normal Patient leans: N/A   Psychiatric Specialty Exam: Physical Exam  Review of Systems  Psychiatric/Behavioral: Positive for hallucinations and substance abuse. The patient is nervous/anxious.   All other systems reviewed and are negative.   Blood pressure 125/78, pulse 103, temperature 97.4 F (36.3 C), temperature source Oral, resp. rate 20, height 6' (1.829 m), weight 75.297 kg (166 lb), SpO2 100 %.Body mass index is 22.51 kg/(m^2).  General Appearance: Fairly Groomed  Engineer, water::  Fair  Speech:  Pressured  Volume:  Increased  Mood:  Euphoric  Affect:  Labile  Thought Process:  Linear  Orientation:  Full (Time, Place, and Person)  Thought Content:  Delusions, Paranoid Ideation and Rumination  Suicidal Thoughts:  No  Homicidal Thoughts:  No  Memory:  Immediate;   Fair Recent;   Fair Remote;   Fair  Judgement:  Impaired  Insight:  Shallow  Psychomotor Activity:  Restlessness  Concentration:  Fair  Recall:  [employer] of Knowledge:Fair  Language: Fair  Akathisia:  No  Handed:  Right  AIMS (if indicated):     Assets:  Communication Skills  ADL's:  Intact  Cognition: WNL  Sleep:  Number of Hours: 5.5     Current Medications: Current Facility-Administered  Medications  Medication Dose Route Frequency Provider Last Rate Last Dose  . acetaminophen (TYLENOL) tablet 650 mg  650 mg Oral Q6H PRN Encarnacion Slates, NP      . alum & mag hydroxide-simeth (MAALOX/MYLANTA) 200-200-20 MG/5ML suspension 30 mL  30 mL Oral Q4H PRN Encarnacion Slates, NP      . benztropine (COGENTIN) tablet 0.5 mg  0.5 mg Oral QPM Jonanthan Bolender, MD      . haloperidol (HALDOL) tablet 5 mg  5 mg Oral Q8H PRN Ursula Alert, MD       And  . diphenhydrAMINE (BENADRYL) capsule 25 mg  25 mg Oral Q8H PRN Ashani Pumphrey, MD      . divalproex (DEPAKOTE ER) 24 hr tablet 750 mg  750 mg Oral QHS Annalise Mcdiarmid, MD   750 mg at 08/14/15 2325  . gabapentin (NEURONTIN) capsule 300 mg  300 mg Oral QID Encarnacion Slates, NP   300 mg at 08/15/15 1203  . haloperidol (HALDOL) tablet 5 mg  5 mg Oral QPM Ursula Alert, MD      . Derrill Memo ON 08/24/2015] haloperidol decanoate (HALDOL DECANOATE) 100 MG/ML injection 75 mg  75 mg Intramuscular Q30 days Ursula Alert, MD      . hydrocortisone cream 1 %   Topical BID PRN Ursula Alert, MD      . hydrOXYzine (ATARAX/VISTARIL) tablet 25 mg  25 mg Oral Q6H PRN Ursula Alert, MD   25 mg at 08/14/15 1601  . magnesium hydroxide (MILK OF MAGNESIA) suspension 30 mL  30 mL Oral Daily PRN Encarnacion Slates, NP      . nicotine polacrilex (NICORETTE) gum 2 mg  2 mg Oral PRN Ursula Alert, MD   2 mg at 08/15/15 1411  . traZODone (DESYREL) tablet 50 mg  50 mg Oral QHS PRN Encarnacion Slates, NP        Lab Results:  Results for orders placed or performed during the hospital encounter of 08/13/15 (from the past 48 hour(s))  Hemoglobin A1c     Status: None   Collection Time: 08/14/15  6:21 AM  Result Value Ref Range   Hgb A1c MFr Bld 5.5 4.8 - 5.6 %    Comment: (NOTE)         Pre-diabetes: 5.7 - 6.4         Diabetes: >6.4         Glycemic control for adults with diabetes: <7.0    Mean Plasma Glucose 111 mg/dL    Comment: (NOTE) Performed At: [facility] [place], [place] [id number] Lindon Romp MD NW:[id number] Performed at [facility]   Lipid panel, fasting     Status: Abnormal   Collection Time: 08/14/15  6:21 AM  Result Value Ref Range   Cholesterol 172 0 - 200 mg/dL   Triglycerides 271 (H) <150 mg/dL   HDL 33 (L) >40 mg/dL   Total CHOL/HDL Ratio 5.2 RATIO   VLDL 54 (H) 0 - 40 mg/dL   LDL Cholesterol 85 0 - 99 mg/dL    Comment:        Total Cholesterol/HDL:CHD Risk Coronary Heart Disease Risk Table                     Men   Women  1/2 Average Risk   3.4   3.3  Average Risk       5.0   4.4  2 X Average Risk   9.6   7.1  3 X Average Risk  23.4   11.0        Use the calculated Patient Ratio above and the CHD Risk Table to determine the patient's CHD Risk.        ATP III CLASSIFICATION (LDL):  <100     mg/dL   Optimal  100-129  mg/dL   Near or Above                    Optimal  130-159  mg/dL   Borderline  160-189  mg/dL   High  >190     mg/dL   Very High Performed at [facility]   TSH     Status: Abnormal   Collection Time: 08/15/15  7:10 AM  Result Value Ref Range   TSH 4.723 (H) 0.350 - 4.500 uIU/mL    Comment: Performed at [facility]  Lipid panel     Status: Abnormal   Collection Time: 08/15/15  7:10 AM  Result Value Ref Range   Cholesterol 185 0 - 200 mg/dL   Triglycerides 274 (H) <150 mg/dL   HDL 37 (L) >40 mg/dL   Total CHOL/HDL Ratio 5.0 RATIO   VLDL 55 (H) 0 - 40 mg/dL   LDL Cholesterol 93 0 - 99 mg/dL    Comment:        Total Cholesterol/HDL:CHD Risk Coronary Heart Disease Risk Table                     Men   Women  1/2 Average Risk   3.4   3.3  Average Risk       5.0   4.4  2 X Average Risk   9.6   7.1  3 X Average Risk  23.4   11.0        Use the calculated Patient Ratio above and the CHD Risk Table to determine the patient's CHD Risk.        ATP III CLASSIFICATION (LDL):  <100     mg/dL   Optimal  100-129  mg/dL   Near or Above                     Optimal  130-159  mg/dL   Borderline  160-189  mg/dL   High  >190     mg/dL   Very High Performed at [facility]     Physical Findings: AIMS: Facial and Oral Movements Muscles of Facial Expression: None, normal Lips and Perioral Area: None, normal Jaw: None, normal Tongue: None, normal,Extremity Movements Upper (arms, wrists, hands, fingers): None, normal Lower (legs, knees, ankles, toes): None, normal, Trunk Movements Neck, shoulders, hips: None, normal, Overall Severity Severity of abnormal  movements (highest score from questions above): None, normal Incapacitation due to abnormal movements: None, normal Patient's awareness of abnormal movements (rate only patient's report): No Awareness, Dental Status Current problems with teeth and/or dentures?: No Does patient usually wear dentures?: No  CIWA:  CIWA-Ar Total: 1 COWS:  COWS Total Score: 2   Assessment: Vadhir Mcnay Peavler is a 44 y.o. White male, single, Caucasian who presented to [facility] ED accompanied by his fiancee for disorganized behavior.Patient continues to appear as restless, hyperactive and manic  . Will continue treatment.  Treatment Plan Summary: Daily contact with patient to assess and evaluate symptoms and progress in treatment and Medication management  Per collateral information obtained from out pt provider per CSW - pt received his Haldol decanoate as divided doses - a total dose of 75 mg IM , last dose received on 07/30/15. Pt will be unable to afford Abilify- hence will DC Abilify. Will start Haldol 5 mg po qhs for psychosis. Will continue Depakote ER 750 MG PO QHS for mood sx.depakote level on 08/17/15. Trazodone 50 mg po qhs for sleep. Will continue to monitor vitals ,medication compliance and treatment side effects while patient is here.  Will monitor for medical issues as well as call consult as needed.  Reviewed labs- lipid panel - abnormal - dietician consult. CSW will start working on  disposition.  Patient to participate in therapeutic milieu .    Medical Decision Making:  Review of Psycho-Social Stressors (1), Established Problem, Worsening (2), Review of Last Therapy Session (1), Review of Medication Regimen & Side Effects (2) and Review of New Medication or Change in Dosage (2)     Laylah Riga md 08/15/2015, 3:01 PM

## 2015-08-15 NOTE — Progress Notes (Signed)
D: Pt denies SI/HI/AVH. Pt is pleasant and cooperative. Pt pacing the unit earlier complaining iof having reactions to Haldol. Pt fixated on the fact that he was given (by his own admission) too much of the Haldol shot before coming in here and the pill was putting too much Haldol in his system. Pt was given 25 Benadryl and 25 vistaril which calmed him down and appeared to solve problem pt was complaining about.   A: Pt was offered support and encouragement. Pt was given scheduled medications. Pt was encourage to attend groups. Q 15 minute checks were done for safety.    R:Pt attends groups and interacts well with peers and staff. Pt is taking medication. Pt receptive to treatment and safety maintained on unit.

## 2015-08-16 LAB — PROLACTIN: Prolactin: 31.5 ng/mL — ABNORMAL HIGH (ref 4.0–15.2)

## 2015-08-16 LAB — HEMOGLOBIN A1C
HEMOGLOBIN A1C: 5.6 % (ref 4.8–5.6)
MEAN PLASMA GLUCOSE: 114 mg/dL

## 2015-08-16 NOTE — Plan of Care (Signed)
Problem: Alteration in mood Goal: LTG-Patient reports reduction in suicidal thoughts (Patient reports reduction in suicidal thoughts and is able to verbalize a safety plan for whenever patient is feeling suicidal)  Outcome: Progressing Denies SI at this time   Problem: Alteration in mood & ability to function due to Goal: LTG-Pt reports reduction in suicidal thoughts (Patient reports reduction in suicidal thoughts and is able to verbalize a safety plan for whenever patient is feeling suicidal)  Outcome: Progressing Denies SI at this time

## 2015-08-16 NOTE — [facility] Group Notes (Signed)
[facility] Group Notes:  (Nursing/MHT/Case Management/Adjunct)  Date:  08/16/2015  Time:  0930  Type of Therapy:  Nurse Education  / Leisure Time : The group is focused on helping patients understand how beneficial incorporating leisure time into their lives can be .  Participation Level:  Active  Participation Quality:  Attentive  Affect:  Appropriate  Cognitive:  Appropriate  Insight:  Good  Engagement in Group:  Limited  Modes of Intervention:  Education  Summary of Progress/Problems:  Blake Hardin 08/16/2015, 6:24 PM

## 2015-08-16 NOTE — Progress Notes (Signed)
D: Pt denies SI/HI/AVH. Pt is pleasant and cooperative. Pt seen interacting on unit. Pt continues to be hypo-manic, but pt appears very redirectable. Pt continues to be focused on injectable he got before coming in, but pt appears happier. Pt stated "got my meds right".   A: Pt was offered support and encouragement. Pt was given scheduled medications. Pt was encourage to attend groups. Q 15 minute checks were done for safety.    R:Pt attends groups and interacts well with peers and staff. Pt is taking medication. Pt has no complaints at this time .Pt receptive to treatment and safety maintained on unit.

## 2015-08-16 NOTE — Progress Notes (Signed)
NUTRITION NOTE  Pt seen for consult for hyperlipidemia diet education. Tech assisted RD in approaching pt; notes have indicated that pt has been threatening at times.  Asked pt about appetite since admission and he reports that he has been eating well, ate breakfast this AM, but that the food is not good. He indicates that he had previously lived with a Lawyer and that his favorite restaurant is [employer] in [place], [place] but that he also likes [employer].   Pt began to pace around and look around anxiously during discussion, stating that he needs to leave and has a family reunion tomorrow. Asked pt if he had any nutrition-related questions. He states "I am healthier than any doctor here" and then walked past RD.   Did not attempt diet education this AM based on interaction with pt and his demeanor.     Jarome Matin, RD, LDN Inpatient Clinical Dietitian Pager # [phone] After hours/weekend pager # [phone]

## 2015-08-16 NOTE — Progress Notes (Signed)
Blake Hardin has ahd a quiet, average day, in that he has modulated his behavior pretty well as evidenced by him talking and sharing with others, but able to back down and let conversation go, if others around him do not reciprocate!   A He takes his meds as ordered. HE  is adamantly AGAINST taking haldol po and refuses his doses today ( communicated this to his physician immediately). He has tendency to get side-tracked when he begins to speak about religion. HE demonstrates good spirit and insight when he takes direction from this nurse ( to not preach religion to other patients) . He completes his daily assessment and on it he writes he denies SI and he rates his depression, hopelessness and anxiety " 0/0/0", respectivley.   R Safet is in pale

## 2015-08-16 NOTE — Progress Notes (Signed)
[facility] MD Progress Note  08/16/2015 12:56 PM Blake Hardin  MRN:  [id number] Subjective:  Patient states " I am ok."  Objective: Blake Hardin is a 44 y.o. White male, single, Caucasian who presented to [facility] ED accompanied by his fiancee for disorganized behavior. Pt per initial notes in EHR - was feeling agitated ,he had initially gone to [facility] , where he was reportedly called a drug addict , and was told to leave . Pt also was preaching a lot since the past several days, was kicked out of three different churches for the same , was having grandiose delusions that he was going to buy the [facility] as well as made threats online about burying the current sheriff next to a deceased man."   Patient seen this AM. Patient with improvement of his restlessness, reports sleep as good. He did have some AH last night , but denies it now. Pt had an EPS reaction to Haldol last night and reports that he does not want to be on it any more. Pt would prefer to be on Haldol dec only - which he received last month. Pt continues to be  focussed on getting discharged from the unit. Per staff - pt is compliant on medications , did report EPS to haldol last night. Has been seen as grandiose and restless from time to time.    Principal Problem: Schizoaffective disorder, bipolar type Diagnosis:   Patient Active Problem List   Diagnosis Date Noted  . Schizoaffective disorder, bipolar type [F25.0] 08/14/2015  . Cannabis use disorder, severe, dependence [F12.20] 08/14/2015  . Stimulant use disorder [F15.99] 08/14/2015   Total Time spent with patient: 25 minutes   Past Medical History:  Past Medical History  Diagnosis Date  . Bipolar 1 disorder   . Schizoaffective disorder   . Chicken pox     Past Surgical History  Procedure Laterality Date  . Lung surgery    . Tonsillectomy    . Myringotomy     Family History:  Family History  Problem Relation Age of Onset  . Mental  illness Sister   . Mental illness Brother    Social History:  History  Alcohol Use  . Yes    Comment: 3-4x a week beer     History  Drug Use  . Yes  . Special: Methamphetamines, Marijuana    Social History   Social History  . Marital Status: Single    Spouse Name: N/A  . Number of Children: N/A  . Years of Education: N/A   Social History Main Topics  . Smoking status: Current Every Day Smoker    Types: Cigarettes  . Smokeless tobacco: None  . Alcohol Use: Yes     Comment: 3-4x a week beer  . Drug Use: Yes    Special: Methamphetamines, Marijuana  . Sexual Activity: Not Asked   Other Topics Concern  . None   Social History Narrative   Additional History:    Sleep: Fair  Appetite:  Fair     Musculoskeletal: Strength & Muscle Tone: within normal limits Gait & Station: normal Patient leans: N/A   Psychiatric Specialty Exam: Physical Exam  Review of Systems  Psychiatric/Behavioral: Positive for substance abuse. Negative for hallucinations. The patient is nervous/anxious.   All other systems reviewed and are negative.   Blood pressure 130/101, pulse 94, temperature 97.4 F (36.3 C), temperature source Oral, resp. rate 20, height 6' (1.829 m), weight 75.297 kg (166 lb),  SpO2 100 %.Body mass index is 22.51 kg/(m^2).  General Appearance: Fairly Groomed  Engineer, water::  Fair  Speech:  Pressured improving  Volume:  Normal  Mood:  Anxious  Affect:  Labile  Thought Process:  Linear  Orientation:  Full (Time, Place, and Person)  Thought Content:  Delusions, Paranoid Ideation and Rumination improving  Suicidal Thoughts:  No  Homicidal Thoughts:  No  Memory:  Immediate;   Fair Recent;   Fair Remote;   Fair  Judgement:  Impaired  Insight:  Shallow  Psychomotor Activity:  Restlessness  Concentration:  Fair  Recall:  [employer] of Knowledge:Fair  Language: Fair  Akathisia:  No  Handed:  Right  AIMS (if indicated):     Assets:  Communication Skills   ADL's:  Intact  Cognition: WNL  Sleep:  Number of Hours: 6     Current Medications: Current Facility-Administered Medications  Medication Dose Route Frequency Provider Last Rate Last Dose  . acetaminophen (TYLENOL) tablet 650 mg  650 mg Oral Q6H PRN Encarnacion Slates, NP      . alum & mag hydroxide-simeth (MAALOX/MYLANTA) 200-200-20 MG/5ML suspension 30 mL  30 mL Oral Q4H PRN Encarnacion Slates, NP      . benztropine (COGENTIN) tablet 0.5 mg  0.5 mg Oral QPM Charbel Los, MD   0.5 mg at 08/15/15 1730  . haloperidol (HALDOL) tablet 5 mg  5 mg Oral Q8H PRN Ursula Alert, MD       And  . diphenhydrAMINE (BENADRYL) capsule 25 mg  25 mg Oral Q8H PRN Ursula Alert, MD   25 mg at 08/15/15 1923  . divalproex (DEPAKOTE ER) 24 hr tablet 750 mg  750 mg Oral QHS Ursula Alert, MD   750 mg at 08/16/15 0619  . gabapentin (NEURONTIN) capsule 300 mg  300 mg Oral QID Encarnacion Slates, NP   300 mg at 08/16/15 0824  . haloperidol (HALDOL) tablet 5 mg  5 mg Oral QPM Ursula Alert, MD   5 mg at 08/15/15 1731  . [START ON 08/24/2015] haloperidol decanoate (HALDOL DECANOATE) 100 MG/ML injection 75 mg  75 mg Intramuscular Q30 days Ursula Alert, MD      . hydrocortisone cream 1 %   Topical BID PRN Ursula Alert, MD      . hydrOXYzine (ATARAX/VISTARIL) tablet 25 mg  25 mg Oral Q6H PRN Ursula Alert, MD   25 mg at 08/15/15 2028  . magnesium hydroxide (MILK OF MAGNESIA) suspension 30 mL  30 mL Oral Daily PRN Encarnacion Slates, NP      . nicotine polacrilex (NICORETTE) gum 2 mg  2 mg Oral PRN Ursula Alert, MD   2 mg at 08/16/15 1005  . traZODone (DESYREL) tablet 50 mg  50 mg Oral QHS PRN Encarnacion Slates, NP        Lab Results:  Results for orders placed or performed during the hospital encounter of 08/13/15 (from the past 48 hour(s))  TSH     Status: Abnormal   Collection Time: 08/15/15  7:10 AM  Result Value Ref Range   TSH 4.723 (H) 0.350 - 4.500 uIU/mL    Comment: Performed at [facility]  Lipid  panel     Status: Abnormal   Collection Time: 08/15/15  7:10 AM  Result Value Ref Range   Cholesterol 185 0 - 200 mg/dL   Triglycerides 274 (H) <150 mg/dL   HDL 37 (L) >40 mg/dL   Total CHOL/HDL Ratio 5.0  RATIO   VLDL 55 (H) 0 - 40 mg/dL   LDL Cholesterol 93 0 - 99 mg/dL    Comment:        Total Cholesterol/HDL:CHD Risk Coronary Heart Disease Risk Table                     Men   Women  1/2 Average Risk   3.4   3.3  Average Risk       5.0   4.4  2 X Average Risk   9.6   7.1  3 X Average Risk  23.4   11.0        Use the calculated Patient Ratio above and the CHD Risk Table to determine the patient's CHD Risk.        ATP III CLASSIFICATION (LDL):  <100     mg/dL   Optimal  100-129  mg/dL   Near or Above                    Optimal  130-159  mg/dL   Borderline  160-189  mg/dL   High  >190     mg/dL   Very High Performed at [facility]   Hemoglobin A1c     Status: None   Collection Time: 08/15/15  7:10 AM  Result Value Ref Range   Hgb A1c MFr Bld 5.6 4.8 - 5.6 %    Comment: (NOTE)         Pre-diabetes: 5.7 - 6.4         Diabetes: >6.4         Glycemic control for adults with diabetes: <7.0    Mean Plasma Glucose 114 mg/dL    Comment: (NOTE) Performed At: [facility] [place], [place] [id number] Lindon Romp MD [id number] Performed at [facility]   Prolactin     Status: Abnormal   Collection Time: 08/15/15  7:10 AM  Result Value Ref Range   Prolactin 31.5 (H) 4.0 - 15.2 ng/mL    Comment: (NOTE) Performed At: [facility] [place], [place] [id number] Lindon Romp MD [id number] Performed at [facility]   Occult blood card to lab, stool RN will collect     Status: None   Collection Time: 08/15/15  2:57 PM  Result Value Ref Range   Fecal Occult Bld NEGATIVE NEGATIVE    Comment: Performed at [facility]    Physical Findings: AIMS: Facial and  Oral Movements Muscles of Facial Expression: None, normal Lips and Perioral Area: None, normal Jaw: None, normal Tongue: None, normal,Extremity Movements Upper (arms, wrists, hands, fingers): None, normal Lower (legs, knees, ankles, toes): None, normal, Trunk Movements Neck, shoulders, hips: None, normal, Overall Severity Severity of abnormal movements (highest score from questions above): None, normal Incapacitation due to abnormal movements: None, normal Patient's awareness of abnormal movements (rate only patient's report): No Awareness, Dental Status Current problems with teeth and/or dentures?: No Does patient usually wear dentures?: No  CIWA:  CIWA-Ar Total: 1 COWS:  COWS Total Score: 2   Assessment: Dhaval Woo Buikema is a 44 y.o. White male, single, Caucasian who presented to [facility] ED accompanied by his fiancee for disorganized behavior.Patient with improvement of  Restlessness and mania . Will continue treatment.  Treatment Plan Summary: Daily contact with patient to assess and evaluate symptoms and progress in treatment and Medication management  Per collateral information obtained from out  pt provider per CSW - pt received his Haldol decanoate as divided doses - a total dose of 75 mg IM , last dose received on 07/30/15. Will DC haldol - 2/2 side effect. Pt wants to be on Haldol decanoate - which he received recently. Will continue Depakote ER 750 MG PO QHS for mood sx.depakote level on 08/17/15. Trazodone 50 mg po qhs for sleep. Will continue to monitor vitals ,medication compliance and treatment side effects while patient is here.  Will monitor for medical issues as well as call consult as needed. Fecal occult blood negative. TSH elevated - will get free t4, t3. Reviewed labs- lipid panel - abnormal - dietician consult. CSW will start working on disposition.  Patient to participate in therapeutic milieu .    Medical Decision Making:  Review of Psycho-Social Stressors  (1), Review of Last Therapy Session (1) and Review of Medication Regimen & Side Effects (2)     Kierre Hintz md 08/16/2015, 12:56 PM

## 2015-08-16 NOTE — Plan of Care (Signed)
Problem: Ineffective individual coping Goal: LTG: Patient will report a decrease in negative feelings Outcome: Progressing Blake Hardin says he is " feeling better" today and he denies experiencing negative feelings today and this is evidenced by his documentation on his daily assessment of today, on which he wrote that he denied SI today and he rated his depression, hopelessness and anxiety " 0/0/0", respectively.

## 2015-08-16 NOTE — BHH Group Notes (Signed)
[place] LCSW Group Therapy  08/16/2015 1:15 pm  Type of Therapy: Process Group Therapy  Participation Level:  Active  Participation Quality:  Appropriate  Affect:  Flat  Cognitive:  Oriented  Insight:  Improving  Engagement in Group:  Limited  Engagement in Therapy:  Limited  Modes of Intervention:  Activity, Clarification, Education, Problem-solving and Support  Summary of Progress/Problems: Today's group addressed the issue of overcoming obstacles.  Patients were asked to identify their biggest obstacle post d/c that stands in the way of their on-going success, and then problem solve as to how to manage this.  Left once, but returned.  Engaged throughout.  Among all the delusional statements, admitted to drug use and acknowledges it set him back.  Also talked about his hurt and disappointment that his daughters are reluctant to allow him to see his grandchildren.  Unfortunately, he was not able to take the next step and talk about his responsibility and what he needs to do to rectify that situation.  Chose to blame them instead.  Roque Lias B 08/16/2015   5:10 PM

## 2015-08-17 LAB — VALPROIC ACID LEVEL: VALPROIC ACID LVL: 38 ug/mL — AB (ref 50.0–100.0)

## 2015-08-17 LAB — T3: T3 TOTAL: 149 ng/dL (ref 71–180)

## 2015-08-17 LAB — T4, FREE: Free T4: 1.01 ng/dL (ref 0.61–1.12)

## 2015-08-17 MED ORDER — DIVALPROEX SODIUM ER 500 MG PO TB24
1000.0000 mg | ORAL_TABLET | Freq: Every day | ORAL | Status: DC
  Administered 2015-08-17 – 2015-08-19 (×3): 1000 mg via ORAL
  Filled 2015-08-17 (×4): qty 2

## 2015-08-17 NOTE — BHH Group Notes (Signed)
[place] LCSW Group Therapy  08/17/2015  1:05 PM  Type of Therapy:  Group therapy  Participation Level:  Active  Participation Quality:  Attentive  Affect:  Flat  Cognitive:  Oriented  Insight:  Limited  Engagement in Therapy:  Limited  Modes of Intervention:  Discussion, Socialization  Summary of Progress/Problems:  Chaplain was here to lead a group on themes of hope and courage. "Hope is trust and faith."  Gave several acronyms for HOPE, and for opposite of HOPE.  "Evil [employer] [place]."  "I'm going to rehab from here because we need to go in a different direction."  Tangential.  Delusions.  Talked fondly and at length about his fiance.  "We started talking on 4/2, and we've been together since.  I plan on stopping them from tearing down the [employer] in [place].  If it was good enough for my grandpa, it's good enough for me."  Religiously preoccupied. Roque Lias B 08/17/2015 1:28 PM

## 2015-08-17 NOTE — Progress Notes (Signed)
Patient ID: Blake Hardin, male   DOB: 05/24/1971, 44 y.o.   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 Client will be monitored q70min for safety. R: Client is safe on the unit attended group.

## 2015-08-17 NOTE — Progress Notes (Signed)
Blake Hardin is seen out in the milieu of the [address] hall today..he tolerates this well. He is pleasant and cooperative. He has less tangential thinking and speech and he attneds his groups in a good hearted manner. HE completes his daily assessment this morning and on it he wrote he denied SI and he rated his depression, hopelessness and anxiety " 0/0/0" respectively.   A He is scheduled for a valporic acid level to be drawn tomorrow morning. He says he " is ready " to go home, but he is unable to identify what he will do if there is conflict between he and his family ( which, he says, there most likely will be ). Will cont to process with pt and help him identify healthier coping skills.

## 2015-08-17 NOTE — Progress Notes (Signed)
Blake Hardin remains pleasantly hyperactive.Marland Kitchen He takes his medications  As planned and he denies discomfort today. HE completes his daily assessment this morning and on it he wrote he denied SI today and he rated his feelings of depression, hopelessness and anxiety " 0/0/0 ", respectively.   A He  Adamantly denies he wants to speak with his spouse about his dc plans and whispers this to him several times today.

## 2015-08-17 NOTE — Progress Notes (Signed)
[place] Group Notes:  (Nursing/MHT/Case Management/Adjunct)  Date:  08/17/2015  Time:  10:18 PM  Type of Therapy:  Psychoeducational Skills  Participation Level:  Active  Participation Quality:  Appropriate  Affect:  Appropriate  Cognitive:  Appropriate  Insight:  Improving  Engagement in Group:  Developing/Improving  Modes of Intervention:  Education  Summary of Progress/Problems: Patient states that he had a "fantastic" day and that he anticipates being discharged this coming Monday. As for theme of the day, his relapse prevention will be to get married and spend more time with his family.   Archie Balboa S 08/17/2015, 10:18 PM

## 2015-08-17 NOTE — Progress Notes (Signed)
[facility] MD Progress Note  08/17/2015 2:20 PM Blake Hardin  MRN:  [id number] Subjective:  Patient states " Am I going home."  Objective: Blake Hardin is a 44 y.o. White male, single, Caucasian who presented to [facility] ED accompanied by his fiancee for disorganized behavior. Pt per initial notes in EHR - was feeling agitated ,he had initially gone to [facility] , where he was reportedly called a drug addict , and was told to leave . Pt also was preaching a lot since the past several days, was kicked out of three different churches for the same , was having grandiose delusions that he was going to buy the [facility] as well as made threats online about burying the current sheriff next to a deceased man."   Patient seen this AM. Patient appeared to be very anxious and restless, kept moving his legs during the entire evaluation. Pt was upset that he was not being discharged today. Pt's family had voiced concerns to CSW about patient being released too soon from the hospital. Pt prior to admission had decompensated significantly . Pt on the unit otherwise seems to be improving on the current medication regimen. Pt is less manic , less pressured , less intrusive. Per staff - he has been compliant on his medications.  Patient also with hx of abusing cannabis on a regular basis as well as snorting  adderall daily prior to admission. Pt is very motivated to get help with his substance abuse problems and would like to go to a long term rehab program.   Principal Problem: Schizoaffective disorder, bipolar type Diagnosis:   Patient Active Problem List   Diagnosis Date Noted  . Schizoaffective disorder, bipolar type [F25.0] 08/14/2015  . Cannabis use disorder, severe, dependence [F12.20] 08/14/2015  . Stimulant use disorder [F15.99] 08/14/2015   Total Time spent with patient: 25 minutes   Past Medical History:  Past Medical History  Diagnosis Date  . Bipolar 1 disorder   .  Schizoaffective disorder   . Chicken pox     Past Surgical History  Procedure Laterality Date  . Lung surgery    . Tonsillectomy    . Myringotomy     Family History:  Family History  Problem Relation Age of Onset  . Mental illness Sister   . Mental illness Brother    Social History:  History  Alcohol Use  . Yes    Comment: 3-4x a week beer     History  Drug Use  . Yes  . Special: Methamphetamines, Marijuana    Social History   Social History  . Marital Status: Single    Spouse Name: N/A  . Number of Children: N/A  . Years of Education: N/A   Social History Main Topics  . Smoking status: Current Every Day Smoker    Types: Cigarettes  . Smokeless tobacco: None  . Alcohol Use: Yes     Comment: 3-4x a week beer  . Drug Use: Yes    Special: Methamphetamines, Marijuana  . Sexual Activity: Not Asked   Other Topics Concern  . None   Social History Narrative   Additional History:    Sleep: Fair  Appetite:  Fair     Musculoskeletal: Strength & Muscle Tone: within normal limits Gait & Station: normal Patient leans: N/A   Psychiatric Specialty Exam: Physical Exam  Review of Systems  Psychiatric/Behavioral: Positive for substance abuse. Negative for hallucinations. The patient is nervous/anxious.   All other systems  reviewed and are negative.   Blood pressure 115/79, pulse 94, temperature 97.6 F (36.4 C), temperature source Oral, resp. rate 20, height 6' (1.829 m), weight 75.297 kg (166 lb), SpO2 100 %.Body mass index is 22.51 kg/(m^2).  General Appearance: Fairly Groomed  Engineer, water::  Fair  Speech:  Pressured improving  Volume:  Normal  Mood:  Anxious  Affect:  Labile  Thought Process:  Linear  Orientation:  Full (Time, Place, and Person)  Thought Content:  Delusions, Paranoid Ideation and Rumination improving  Suicidal Thoughts:  No  Homicidal Thoughts:  No  Memory:  Immediate;   Fair Recent;   Fair Remote;   Fair  Judgement:  Impaired   Insight:  Shallow  Psychomotor Activity:  Restlessness  Concentration:  Fair  Recall:  [employer] of Knowledge:Fair  Language: Fair  Akathisia:  No  Handed:  Right  AIMS (if indicated):     Assets:  Communication Skills  ADL's:  Intact  Cognition: WNL  Sleep:  Number of Hours: 6.75     Current Medications: Current Facility-Administered Medications  Medication Dose Route Frequency Provider Last Rate Last Dose  . acetaminophen (TYLENOL) tablet 650 mg  650 mg Oral Q6H PRN Encarnacion Slates, NP      . alum & mag hydroxide-simeth (MAALOX/MYLANTA) 200-200-20 MG/5ML suspension 30 mL  30 mL Oral Q4H PRN Encarnacion Slates, NP      . haloperidol (HALDOL) tablet 5 mg  5 mg Oral Q8H PRN Ursula Alert, MD       And  . diphenhydrAMINE (BENADRYL) capsule 25 mg  25 mg Oral Q8H PRN Ursula Alert, MD   25 mg at 08/15/15 1923  . divalproex (DEPAKOTE ER) 24 hr tablet 1,000 mg  1,000 mg Oral QHS Cadence Minton, MD      . gabapentin (NEURONTIN) capsule 300 mg  300 mg Oral QID Encarnacion Slates, NP   300 mg at 08/17/15 1206  . [START ON 08/24/2015] haloperidol decanoate (HALDOL DECANOATE) 100 MG/ML injection 75 mg  75 mg Intramuscular Q30 days Ursula Alert, MD      . hydrocortisone cream 1 %   Topical BID PRN Ursula Alert, MD      . hydrOXYzine (ATARAX/VISTARIL) tablet 25 mg  25 mg Oral Q6H PRN Ursula Alert, MD   25 mg at 08/16/15 2101  . magnesium hydroxide (MILK OF MAGNESIA) suspension 30 mL  30 mL Oral Daily PRN Encarnacion Slates, NP      . nicotine polacrilex (NICORETTE) gum 2 mg  2 mg Oral PRN Ursula Alert, MD   2 mg at 08/17/15 1312  . traZODone (DESYREL) tablet 50 mg  50 mg Oral QHS PRN Encarnacion Slates, NP        Lab Results:  Results for orders placed or performed during the hospital encounter of 08/13/15 (from the past 48 hour(s))  Occult blood card to lab, stool RN will collect     Status: None   Collection Time: 08/15/15  2:57 PM  Result Value Ref Range   Fecal Occult Bld NEGATIVE NEGATIVE     Comment: Performed at [facility]  T4, free     Status: None   Collection Time: 08/16/15  7:40 PM  Result Value Ref Range   Free T4 1.01 0.61 - 1.12 ng/dL    Comment: Performed at [facility]  T3     Status: None   Collection Time: 08/16/15  7:40 PM  Result Value Ref  Range   T3, Total 149 71 - 180 ng/dL    Comment: (NOTE) Performed At: [facility] [place], [place] [id number] Lindon Romp MD [id number] Performed at [facility]   Valproic acid level     Status: Abnormal   Collection Time: 08/17/15  6:19 AM  Result Value Ref Range   Valproic Acid Lvl 38 (L) 50.0 - 100.0 ug/mL    Comment: Performed at [facility]    Physical Findings: AIMS: Facial and Oral Movements Muscles of Facial Expression: None, normal Lips and Perioral Area: None, normal Jaw: None, normal Tongue: None, normal,Extremity Movements Upper (arms, wrists, hands, fingers): None, normal Lower (legs, knees, ankles, toes): None, normal, Trunk Movements Neck, shoulders, hips: None, normal, Overall Severity Severity of abnormal movements (highest score from questions above): None, normal Incapacitation due to abnormal movements: None, normal Patient's awareness of abnormal movements (rate only patient's report): No Awareness, Dental Status Current problems with teeth and/or dentures?: No Does patient usually wear dentures?: No  CIWA:  CIWA-Ar Total: 1 COWS:  COWS Total Score: 2   Assessment: Caydn Justen Leske is a 44 y.o. White male, single, Caucasian who presented to [facility] ED accompanied by his fiancee for disorganized behavior.Patient with improvement of  Restlessness and mania . Will continue treatment.  Treatment Plan Summary: Daily contact with patient to assess and evaluate symptoms and progress in treatment and Medication management  Per collateral information obtained from out pt provider per CSW - pt  received his Haldol decanoate as divided doses - a total dose of 75 mg IM , last dose received on 07/30/15. Will continue Haldol decanoate IM q 30 days . Will increase Depakote ER to 1000 MG PO QHS for mood sx.depakote level on 08/17/15- 38 ug/ml . Next depakote level on 08/20/15. Trazodone 50 mg po qhs for sleep. Gabapentin 300 mg po qid for anxiety/mood lability. Will continue to monitor vitals ,medication compliance and treatment side effects while patient is here.  Will monitor for medical issues as well as call consult as needed. Fecal occult blood negative. TSH elevated - ordered free t4, t3 - wnl. Reviewed labs- lipid panel - abnormal - dietician consult. CSW will start working on disposition.  Patient to participate in therapeutic milieu .    Medical Decision Making:  Review of Psycho-Social Stressors (1), Review or order clinical lab tests (1), Review of Last Therapy Session (1), Review of Medication Regimen & Side Effects (2) and Review of New Medication or Change in Dosage (2)     Lamanda Rudder md 08/17/2015, 2:20 PM

## 2015-08-17 NOTE — Progress Notes (Signed)
Patient ID: TYCE DELCID, male   DOB: 11/04/1971, 44 y.o.   MRN: [id number] PER STATE REGULATIONS 482.30  THIS CHART WAS REVIEWED FOR MEDICAL NECESSITY WITH RESPECT TO THE PATIENT'S ADMISSION/ DURATION OF STAY.  NEXT REVIEW DATE:  08/21/2015  Chauncy Lean, RN, BSN CASE MANAGER

## 2015-08-18 NOTE — Progress Notes (Signed)
Blake Hardin is doing well...modulating his behavior and he is taking his scheduled meds as planned.Marland Kitchen  also his nicorette gum ( which he says helps A LOT!). He attends his groups and he is engaged in the [employer] group as evidenced by his active participation in the group discussion. HE completed his daily assessment and on it he wrote he denied SI and he rated his depression, hopelessness and anxiety " 0/0/0", respectively.   A He tends to get bored easily and he has minimal engagement in is recovery, staying very reliigiously preoccupied.,    R he says he hopes to be discharged on Monday. POC cont.

## 2015-08-18 NOTE — Progress Notes (Signed)
Patient ID: Blake Hardin, male   DOB: 04/09/1971, 44 y.o.   MRN: [id number] [facility] MD Progress Note  08/18/2015 1:55 PM Sirron Loga Hallstrom  MRN:  [id number] Subjective:  Patient states " I am feeling great since my medication has been working."  Objective: RONNALD BIDO is a 44 y.o. White male, single, Caucasian who presented to [facility] ED accompanied by his fiancee for disorganized behavior. Pt per initial notes in EHR - was feeling agitated ,he had initially gone to [facility] , where he was reportedly called a drug addict , and was told to leave . Pt also was preaching a lot since the past several days, was kicked out of three different churches for the same , was having grandiose delusions that he was going to buy the [facility] as well as made threats online about burying the current sheriff next to a deceased man."   Patient seen this AM. Patient appeared to be very anxious and minimizes his symptoms. Pt's family had voiced concerns to CSW about patient being released too soon from the hospital. Pt prior to admission had decompensated significantly . Pt on the unit otherwise seems to be improving on the current medication regimen. Pt is less manic , less pressured , less intrusive. Per staff - he has been compliant on his medications. Patient also with hx of abusing cannabis on a regular basis as well as snorting  adderall daily prior to admission. Pt is very motivated to get help with his substance abuse problems and would like to go to a long term rehab program.   Principal Problem: Schizoaffective disorder, bipolar type Diagnosis:   Patient Active Problem List   Diagnosis Date Noted  . Schizoaffective disorder, bipolar type [F25.0] 08/14/2015  . Cannabis use disorder, severe, dependence [F12.20] 08/14/2015  . Stimulant use disorder [F15.99] 08/14/2015   Total Time spent with patient: 25 minutes   Past Medical History:  Past Medical History  Diagnosis Date  .  Bipolar 1 disorder   . Schizoaffective disorder   . Chicken pox     Past Surgical History  Procedure Laterality Date  . Lung surgery    . Tonsillectomy    . Myringotomy     Family History:  Family History  Problem Relation Age of Onset  . Mental illness Sister   . Mental illness Brother    Social History:  History  Alcohol Use  . Yes    Comment: 3-4x a week beer     History  Drug Use  . Yes  . Special: Methamphetamines, Marijuana    Social History   Social History  . Marital Status: Single    Spouse Name: N/A  . Number of Children: N/A  . Years of Education: N/A   Social History Main Topics  . Smoking status: Current Every Day Smoker    Types: Cigarettes  . Smokeless tobacco: None  . Alcohol Use: Yes     Comment: 3-4x a week beer  . Drug Use: Yes    Special: Methamphetamines, Marijuana  . Sexual Activity: Not Asked   Other Topics Concern  . None   Social History Narrative   Additional History:    Sleep: Fair  Appetite:  Fair     Musculoskeletal: Strength & Muscle Tone: within normal limits Gait & Station: normal Patient leans: N/A   Psychiatric Specialty Exam: Physical Exam  Review of Systems  Psychiatric/Behavioral: Positive for substance abuse. Negative for hallucinations. The patient is nervous/anxious.  All other systems reviewed and are negative.   Blood pressure 116/78, pulse 97, temperature 97.5 F (36.4 C), temperature source Oral, resp. rate 16, height 6' (1.829 m), weight 75.297 kg (166 lb), SpO2 100 %.Body mass index is 22.51 kg/(m^2).  General Appearance: Fairly Groomed  Patent attorney::  Fair  Speech:  Pressured improving  Volume:  Normal  Mood:  Anxious  Affect:  Labile  Thought Process:  Linear  Orientation:  Full (Time, Place, and Person)  Thought Content:  Delusions, Paranoid Ideation and Rumination improving  Suicidal Thoughts:  No  Homicidal Thoughts:  No  Memory:  Immediate;   Fair Recent;   Fair Remote;   Fair   Judgement:  Impaired  Insight:  Shallow  Psychomotor Activity:  Restlessness  Concentration:  Fair  Recall:  [employer] of Knowledge:Fair  Language: Fair  Akathisia:  No  Handed:  Right  AIMS (if indicated):     Assets:  Communication Skills  ADL's:  Intact  Cognition: WNL  Sleep:  Number of Hours: 5.5     Current Medications: Current Facility-Administered Medications  Medication Dose Route Frequency Provider Last Rate Last Dose  . acetaminophen (TYLENOL) tablet 650 mg  650 mg Oral Q6H PRN Sanjuana Kava, NP      . alum & mag hydroxide-simeth (MAALOX/MYLANTA) 200-200-20 MG/5ML suspension 30 mL  30 mL Oral Q4H PRN Sanjuana Kava, NP      . haloperidol (HALDOL) tablet 5 mg  5 mg Oral Q8H PRN Jomarie Longs, MD       And  . diphenhydrAMINE (BENADRYL) capsule 25 mg  25 mg Oral Q8H PRN Jomarie Longs, MD   25 mg at 08/15/15 1923  . divalproex (DEPAKOTE ER) 24 hr tablet 1,000 mg  1,000 mg Oral QHS Jomarie Longs, MD   1,000 mg at 08/17/15 2126  . gabapentin (NEURONTIN) capsule 300 mg  300 mg Oral QID Sanjuana Kava, NP   300 mg at 08/18/15 1159  . [START ON 08/24/2015] haloperidol decanoate (HALDOL DECANOATE) 100 MG/ML injection 75 mg  75 mg Intramuscular Q30 days Jomarie Longs, MD      . hydrocortisone cream 1 %   Topical BID PRN Jomarie Longs, MD      . hydrOXYzine (ATARAX/VISTARIL) tablet 25 mg  25 mg Oral Q6H PRN Jomarie Longs, MD   25 mg at 08/18/15 1159  . magnesium hydroxide (MILK OF MAGNESIA) suspension 30 mL  30 mL Oral Daily PRN Sanjuana Kava, NP      . nicotine polacrilex (NICORETTE) gum 2 mg  2 mg Oral PRN Jomarie Longs, MD   2 mg at 08/17/15 1813  . traZODone (DESYREL) tablet 50 mg  50 mg Oral QHS PRN Sanjuana Kava, NP   50 mg at 08/17/15 2241    Lab Results:  Results for orders placed or performed during the hospital encounter of 08/13/15 (from the past 48 hour(s))  T4, free     Status: None   Collection Time: 08/16/15  7:40 PM  Result Value Ref Range   Free T4 1.01  0.61 - 1.12 ng/dL    Comment: Performed at [facility]  T3     Status: None   Collection Time: 08/16/15  7:40 PM  Result Value Ref Range   T3, Total 149 71 - 180 ng/dL    Comment: (NOTE) Performed At: [facility] [address] [place], [place] [id number] Mila Homer MD [id number] Performed at [facility]  Valproic acid level     Status: Abnormal   Collection Time: 08/17/15  6:19 AM  Result Value Ref Range   Valproic Acid Lvl 38 (L) 50.0 - 100.0 ug/mL    Comment: Performed at [facility]    Physical Findings: AIMS: Facial and Oral Movements Muscles of Facial Expression: None, normal Lips and Perioral Area: None, normal Jaw: None, normal Tongue: None, normal,Extremity Movements Upper (arms, wrists, hands, fingers): None, normal Lower (legs, knees, ankles, toes): None, normal, Trunk Movements Neck, shoulders, hips: None, normal, Overall Severity Severity of abnormal movements (highest score from questions above): None, normal Incapacitation due to abnormal movements: None, normal Patient's awareness of abnormal movements (rate only patient's report): No Awareness, Dental Status Current problems with teeth and/or dentures?: No Does patient usually wear dentures?: No  CIWA:  CIWA-Ar Total: 1 COWS:  COWS Total Score: 2   Assessment: Timofei Embery Haq is a 44 y.o. White male, single, Caucasian who presented to [facility] ED accompanied by his fiancee for disorganized behavior.Patient with improvement of  Restlessness and mania . Will continue treatment.  Treatment Plan Summary: Daily contact with patient to assess and evaluate symptoms and progress in treatment and Medication management  Per collateral information obtained from out pt provider per CSW - pt received his Haldol decanoate as divided doses - a total dose of 75 mg IM , last dose received on 07/30/15. Will continue Haldol decanoate IM q 30 days . Will  continue Depakote ER to 1000 MG PO QHS for mood sx.depakote level on 08/17/15- 38 ug/ml . Next depakote level on 08/20/15. Trazodone 50 mg po qhs for sleep. Gabapentin 300 mg po qid for anxiety/mood lability. Will continue to monitor vitals ,medication compliance and treatment side effects while patient is here.  Will monitor for medical issues as well as call consult as needed. Fecal occult blood negative. TSH elevated - reviewed free t4, t3 - wnl. Reviewed labs- lipid panel - abnormal - dietician consult. CSW will start working on disposition.  Patient to participate in therapeutic milieu .    Medical Decision Making:  Review of Psycho-Social Stressors (1), Review or order clinical lab tests (1), Review of Last Therapy Session (1), Review of Medication Regimen & Side Effects (2) and Review of New Medication or Change in Dosage (2)     Lizzie Cokley,JANARDHAHA R. md 08/18/2015, 1:55 PM

## 2015-08-18 NOTE — [facility] Group Notes (Signed)
[place] [facility] Group Notes:  (Clinical Social Work)  08/18/2015  11:15-12:00PM  Summary of Progress/Problems:   The main focus of today's process group was to discuss patients' feelings related to being hospitalized, as well as the difference between "being" and "having" a mental health diagnosis.  It was agreed in general by the group that it would be preferable to avoid future hospitalizations, and we discussed means of doing that.  As a follow-up, problems with adhering to medication recommendations were discussed.  The patient expressed their primary feeling about being hospitalized is "great, but it's not quite as fun as jail."  He was intrusive and appeared at least hypomanic throughout group, especially focused on religiosity.  He stated that he used to work in [employer] until "they started paying me to be crazy at age 44."  He stated this will be his last hospital trip, although he did not say how, and also had disagreed with others' observations that taking medication would be one method of staying well.  He indicated feelings of being a [place] pig, having "family members who are a whole lot smarter than psychiatrists I've had," and was generally argumentative about drugs, marijuana, and more.    Type of Therapy:  Group Therapy - Process  Participation Level:  Active  Participation Quality:  Attentive, Intrusive and Redirectable  Affect:  Appropriate  Cognitive:  Disorganized  Insight:  Limited  Engagement in Therapy:  Limited  Modes of Intervention:  Exploration, Discussion  Selmer Dominion, LCSW 08/18/2015, 12:06 PM

## 2015-08-18 NOTE — Progress Notes (Signed)
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

## 2015-08-18 NOTE — Plan of Care (Signed)
Problem: Diagnosis: Increased Risk For Suicide Attempt Goal: STG-Patient Will Report Suicidal Feelings to Staff Outcome: Progressing Client is safe on the unit, AEB q107min safety checks, denial of any suicidal or homicidal feeling or plans, contracts for safety.

## 2015-08-18 NOTE — Progress Notes (Signed)
Attended wrap up group and stated that his day was a 9.5 he said it would have been a 10 but he is still here. He said he feels safe and good spirits. He is around staff that he feels is helping him and the groups are great.

## 2015-08-19 NOTE — Progress Notes (Signed)
Patient ID: RALLY SEGUR, male   DOB: Jul 18, 1971, 44 y.o.   MRN: [id number] Patient ID: RAEBURN TOKUDA, male   DOB: October 18, 1971, 44 y.o.   MRN: [id number] [facility] MD Progress Note  08/19/2015 4:15 PM Valentin Marke Lacroix  MRN:  [id number]   Subjective:  Patient states " I have no complaints today hoping to be discharged tomorrow"  Objective: HAWKINS DUYCK is a 44 y.o. White male, single, Caucasian who presented to [facility] ED accompanied by his fiancee for disorganized behavior. Pt per initial notes in EHR - was feeling agitated ,he had initially gone to [facility] , where he was reportedly called a drug addict , and was told to leave . Pt also was preaching a lot since the past several days, was kicked out of three different churches for the same , was having grandiose delusions that he was going to buy the [facility] as well as made threats online about burying the current sheriff next to a deceased man."   Patient has been compliant with his medication management and reportedly responding positively. Patient denies current symptoms of depression, anxiety, psychosis and denied current suicidal/homicidal ideation. Patient will be follow-up with the psychiatrist at [facility] recovery services in [place], [place].   Pt on the unit otherwise seems to be improving on the current medication regimen. Pt is less manic , less pressured , less intrusive.  Per staff - he has been compliant on his medications. Patient also with hx of abusing cannabis on a regular basis as well as snorting  adderall daily prior to admission. Pt is very motivated to get help with his substance abuse problems and would like to go to a long term rehab program.   Principal Problem: Schizoaffective disorder, bipolar type Diagnosis:   Patient Active Problem List   Diagnosis Date Noted  . Schizoaffective disorder, bipolar type [F25.0] 08/14/2015  . Cannabis use disorder, severe, dependence [F12.20]  08/14/2015  . Stimulant use disorder [F15.99] 08/14/2015   Total Time spent with patient: 25 minutes   Past Medical History:  Past Medical History  Diagnosis Date  . Bipolar 1 disorder   . Schizoaffective disorder   . Chicken pox     Past Surgical History  Procedure Laterality Date  . Lung surgery    . Tonsillectomy    . Myringotomy     Family History:  Family History  Problem Relation Age of Onset  . Mental illness Sister   . Mental illness Brother    Social History:  History  Alcohol Use  . Yes    Comment: 3-4x a week beer     History  Drug Use  . Yes  . Special: Methamphetamines, Marijuana    Social History   Social History  . Marital Status: Single    Spouse Name: N/A  . Number of Children: N/A  . Years of Education: N/A   Social History Main Topics  . Smoking status: Current Every Day Smoker    Types: Cigarettes  . Smokeless tobacco: None  . Alcohol Use: Yes     Comment: 3-4x a week beer  . Drug Use: Yes    Special: Methamphetamines, Marijuana  . Sexual Activity: Not Asked   Other Topics Concern  . None   Social History Narrative   Additional History:    Sleep: Fair  Appetite:  Fair     Musculoskeletal: Strength & Muscle Tone: within normal limits Gait & Station: normal Patient leans: N/A  Psychiatric Specialty Exam: Physical Exam  Review of Systems  Psychiatric/Behavioral: Positive for substance abuse. Negative for hallucinations. The patient is nervous/anxious.   All other systems reviewed and are negative.   Blood pressure 114/60, pulse 85, temperature 97.6 F (36.4 C), temperature source Oral, resp. rate 16, height 6' (1.829 m), weight 75.297 kg (166 lb), SpO2 100 %.Body mass index is 22.51 kg/(m^2).  General Appearance: Fairly Groomed  Patent attorney::  Fair  Speech:  Pressured improving  Volume:  Normal  Mood:  Anxious  Affect:  Labile  Thought Process:  Linear  Orientation:  Full (Time, Place, and Person)  Thought  Content:  Delusions, Paranoid Ideation and Rumination improving  Suicidal Thoughts:  No  Homicidal Thoughts:  No  Memory:  Immediate;   Fair Recent;   Fair Remote;   Fair  Judgement:  Impaired  Insight:  Shallow  Psychomotor Activity:  Restlessness  Concentration:  Fair  Recall:  [employer] of Knowledge:Fair  Language: Fair  Akathisia:  No  Handed:  Right  AIMS (if indicated):     Assets:  Communication Skills  ADL's:  Intact  Cognition: WNL  Sleep:  Number of Hours: 5     Current Medications: Current Facility-Administered Medications  Medication Dose Route Frequency Provider Last Rate Last Dose  . acetaminophen (TYLENOL) tablet 650 mg  650 mg Oral Q6H PRN Sanjuana Kava, NP      . alum & mag hydroxide-simeth (MAALOX/MYLANTA) 200-200-20 MG/5ML suspension 30 mL  30 mL Oral Q4H PRN Sanjuana Kava, NP      . haloperidol (HALDOL) tablet 5 mg  5 mg Oral Q8H PRN Jomarie Longs, MD       And  . diphenhydrAMINE (BENADRYL) capsule 25 mg  25 mg Oral Q8H PRN Jomarie Longs, MD   25 mg at 08/18/15 2105  . divalproex (DEPAKOTE ER) 24 hr tablet 1,000 mg  1,000 mg Oral QHS Jomarie Longs, MD   1,000 mg at 08/18/15 2105  . gabapentin (NEURONTIN) capsule 300 mg  300 mg Oral QID Sanjuana Kava, NP   300 mg at 08/19/15 1200  . [START ON 08/24/2015] haloperidol decanoate (HALDOL DECANOATE) 100 MG/ML injection 75 mg  75 mg Intramuscular Q30 days Jomarie Longs, MD      . hydrocortisone cream 1 %   Topical BID PRN Jomarie Longs, MD      . hydrOXYzine (ATARAX/VISTARIL) tablet 25 mg  25 mg Oral Q6H PRN Jomarie Longs, MD   25 mg at 08/19/15 0745  . magnesium hydroxide (MILK OF MAGNESIA) suspension 30 mL  30 mL Oral Daily PRN Sanjuana Kava, NP      . nicotine polacrilex (NICORETTE) gum 2 mg  2 mg Oral PRN Jomarie Longs, MD   2 mg at 08/19/15 0744  . traZODone (DESYREL) tablet 50 mg  50 mg Oral QHS PRN Sanjuana Kava, NP   50 mg at 08/18/15 2105    Lab Results:  No results found for this or any previous  visit (from the past 48 hour(s)).  Physical Findings: AIMS: Facial and Oral Movements Muscles of Facial Expression: None, normal Lips and Perioral Area: None, normal Jaw: None, normal Tongue: None, normal,Extremity Movements Upper (arms, wrists, hands, fingers): None, normal Lower (legs, knees, ankles, toes): None, normal, Trunk Movements Neck, shoulders, hips: None, normal, Overall Severity Severity of abnormal movements (highest score from questions above): None, normal Incapacitation due to abnormal movements: None, normal Patient's awareness of abnormal movements (rate only patient's  report): No Awareness, Dental Status Current problems with teeth and/or dentures?: No Does patient usually wear dentures?: No  CIWA:  CIWA-Ar Total: 1 COWS:  COWS Total Score: 2   Assessment: Green Scherr Crunkleton is a 44 y.o. White male, single, Caucasian who presented to [facility] ED accompanied by his fiancee for disorganized behavior.Patient with improvement of  Restlessness and mania .   Treatment Plan Summary: Daily contact with patient to assess and evaluate symptoms and progress in treatment and Medication management  Patient will continue current treatment plan and medication management as prescribed  Per collateral information obtained from out pt provider per CSW - pt received his Haldol decanoate as divided doses - a total dose of 75 mg IM , last dose received on 07/30/15.  Will continue Haldol decanoate IM q 30 days . Will continue Depakote ER to 1000 MG PO QHS for mood sx.depakote level on 08/17/15- 38 ug/ml . Next depakote level on 08/20/15. Trazodone 50 mg po qhs for sleep. Gabapentin 300 mg po qid for anxiety/mood lability. Will continue to monitor vitals ,medication compliance and treatment side effects while patient is here.  Will monitor for medical issues as well as call consult as needed. Fecal occult blood negative. TSH elevated - reviewed free t4, t3 - wnl. Reviewed labs- lipid  panel - abnormal - dietician consult. CSW will start working on disposition.  Patient to participate in therapeutic milieu .    Medical Decision Making:  Review of Psycho-Social Stressors (1), Review or order clinical lab tests (1), Review of Last Therapy Session (1), Review of Medication Regimen & Side Effects (2) and Review of New Medication or Change in Dosage (2)     Mathilda Maguire,JANARDHAHA R. md 08/19/2015, 4:15 PM

## 2015-08-19 NOTE — Progress Notes (Signed)
D Josh remains religiously preoccupied as evidenced by his consistent conversation about how he is " saved" and that his books have been " touched by God himself ." He wanders in and out of other peoples' conversations and is observed to intrude, if he is not acknowledged.    A He takes his scheduled meds and he is given 2 prn doses of po vistaril, for c/o " nerves" and it helps him both times he takes it. He completes his daily assessment and on it he writes he denies SI and he rates his  Depression , hopelessness and anxiety " 0/0/0".    R Safety in place and he reports he is expecting to do home tomorrow.

## 2015-08-19 NOTE — Progress Notes (Signed)
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

## 2015-08-19 NOTE — [facility] Group Notes (Signed)
[place] Group Notes:  (Clinical Social Work)  08/19/2015  [place] Group Notes:  (Clinical Social Work)  08/19/2015  11:00AM-12:00PM  Summary of Progress/Problems:  The main focus of today's process group was to listen to a variety of genres of music and to identify that different types of music provoke different responses.  The patient then was able to identify personally what was soothing for them, as well as energizing.  Handouts were used to record feelings evoked, as well as how patient can personally use this knowledge in sleep habits, with depression, and with other symptoms.  The patient expressed understanding of concepts, as well as knowledge of how each type of music affected him and how this can be used at home as a wellness/recovery tool.  He was somewhat irritable at times, i.e. When CSW mistakenly called him "Josh."  He said one song which is very popular had a satanic beat and was not good.  He made the statement, "wait until you hear the bands I'm signing."  Type of Therapy:  Music Therapy   Participation Level:  Active  Participation Quality:  Attentive and Sharing  Affect:  Blunted and Irritable  Cognitive:  Oriented  Insight:  Engaged  Engagement in Therapy:  Engaged  Modes of Intervention:   Activity, Exploration  Selmer Dominion, LCSW 08/19/2015

## 2015-08-20 LAB — VALPROIC ACID LEVEL: VALPROIC ACID LVL: 51 ug/mL (ref 50.0–100.0)

## 2015-08-20 MED ORDER — DIVALPROEX SODIUM ER 500 MG PO TB24: 1000.0000 mg | ORAL_TABLET | Freq: Every day | ORAL | Status: DC

## 2015-08-20 MED ORDER — NICOTINE POLACRILEX 2 MG MT GUM: 2.0000 mg | CHEWING_GUM | OROMUCOSAL | Status: DC | PRN

## 2015-08-20 MED ORDER — TRAZODONE HCL 50 MG PO TABS: 50.0000 mg | ORAL_TABLET | Freq: Every evening | ORAL | Status: DC | PRN

## 2015-08-20 MED ORDER — HALOPERIDOL DECANOATE 100 MG/ML IM SOLN: 75.0000 mg | INTRAMUSCULAR | Status: DC

## 2015-08-20 MED ORDER — GABAPENTIN 300 MG PO CAPS: 300.0000 mg | ORAL_CAPSULE | Freq: Four times a day (QID) | ORAL | Status: DC

## 2015-08-20 NOTE — Progress Notes (Signed)
  [facility] Adult Case Management Discharge Plan :  Will you be returning to the same living situation after discharge:  Yes,  home At discharge, do you have transportation home?: Yes,  SO Do you have the ability to pay for your medications: Yes,  MCD  Release of information consent forms completed and in the chart;  Patient's signature needed at discharge.  Patient to Follow up at: Follow-up Information    Follow up with [facility] On 08/22/2015.   Why:  Wed at 8:00 for your hospital follow up appointment   Contact information:   [employer] [phone]      Follow up with [facility].   Why:  Continue to call Shayla so you can be screened for a bed.     Contact information:   [place] [phone]       Patient denies SI/HI: Yes,  yes    Safety Planning and Suicide Prevention discussed: Yes,  yes     Has patient been referred to the Quitline?: Yes, faxed on 08/20/15  Trish Mage 08/20/2015, 2:47 PM

## 2015-08-20 NOTE — BHH Suicide Risk Assessment (Signed)
[facility] Discharge Suicide Risk Assessment   Demographic Factors:  Caucasian  Total Time spent with patient: 30 minutes  Musculoskeletal: Strength & Muscle Tone: within normal limits Gait & Station: normal Patient leans: N/A  Psychiatric Specialty Exam: Physical Exam  Review of Systems  Psychiatric/Behavioral: Positive for substance abuse. Negative for depression, suicidal ideas and hallucinations. The patient is not nervous/anxious and does not have insomnia.   All other systems reviewed and are negative.   Blood pressure 109/83, pulse 85, temperature 98.6 F (37 C), temperature source Oral, resp. rate 20, height 6' (1.829 m), weight 75.297 kg (166 lb), SpO2 100 %.Body mass index is 22.51 kg/(m^2).  General Appearance: Casual  Eye Contact::  Good  Speech:  Clear and Coherent409  Volume:  Normal  Mood:  Euthymic  Affect:  Appropriate  Thought Process:  Coherent  Orientation:  Full (Time, Place, and Person)  Thought Content:  WDL  Suicidal Thoughts:  No  Homicidal Thoughts:  No  Memory:  Immediate;   Fair Recent;   Fair Remote;   Fair  Judgement:  Fair  Insight:  Fair  Psychomotor Activity:  Normal  Concentration:  Fair  Recall:  [employer] of Knowledge:Fair  Language: Fair  Akathisia:  No  Handed:  Right  AIMS (if indicated):     Assets:  Communication Skills Desire for Improvement  Sleep:  Number of Hours: 4.25  Cognition: WNL  ADL's:  Intact      Has this patient used any form of tobacco in the last 30 days? (Cigarettes, Smokeless Tobacco, Cigars, and/or Pipes) Yes, Prescription provided FOR  NICOTINE GUM  Mental Status Per Nursing Assessment::   On Admission:     Current Mental Status by Physician: Pt denies SI/HI/AH/VH  Loss Factors: NA  Historical Factors: Impulsivity  Risk Reduction Factors:   Living with another person, especially a relative and Positive social support  Continued Clinical Symptoms:  Alcohol/Substance Abuse/Dependencies Previous  Psychiatric Diagnoses and Treatments  Cognitive Features That Contribute To Risk:  Polarized thinking    Suicide Risk:  Minimal: No identifiable suicidal ideation.  Patients presenting with no risk factors but with morbid ruminations; may be classified as minimal risk based on the severity of the depressive symptoms  Principal Problem: Schizoaffective disorder, bipolar type Discharge Diagnoses:  Patient Active Problem List   Diagnosis Date Noted  . Schizoaffective disorder, bipolar type [F25.0] 08/14/2015  . Cannabis use disorder, severe, dependence [F12.20] 08/14/2015  . Stimulant use disorder [F15.99] 08/14/2015      Plan Of Care/Follow-up recommendations:  Activity:  No restrictions Diet:  regular Tests:  PL level in 3 months - pt did not want a trial of Abilify or seroquel -is comfortable with continuing Haldol decanoate IM as scheduled. Other:  follow up with after care  Is patient on multiple antipsychotic therapies at discharge:  No   Has Patient had three or more failed trials of antipsychotic monotherapy by history:  No  Recommended Plan for Multiple Antipsychotic Therapies: NA    Chassie Pennix MD 08/20/2015, 9:58 AM

## 2015-08-20 NOTE — Discharge Summary (Signed)
Physician Discharge Summary Note  Patient:  Blake Hardin is an 44 y.o., male MRN:  [id number] DOB:  [birth year] Patient phone:  [phone] (home)  Patient address:   [address] [place] [address],  Total Time spent with patient: 30 minutes  Date of Admission:  08/13/2015 Date of Discharge: 08/20/2015  Reason for Admission:  Mood stabilization treatments  Principal Problem: Schizoaffective disorder, bipolar type Discharge Diagnoses: Patient Active Problem List   Diagnosis Date Noted  . Schizoaffective disorder, bipolar type [F25.0] 08/14/2015  . Cannabis use disorder, severe, dependence [F12.20] 08/14/2015  . 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   HENT: Negative.   Eyes: Negative.   Respiratory: Negative.   Cardiovascular: Negative.   Gastrointestinal: Negative.   Genitourinary: Negative.   Musculoskeletal: Negative.   Skin: Negative.   Neurological: Negative.   Endo/Heme/Allergies: Negative.   Psychiatric/Behavioral: Positive for substance abuse (Positive for marijuana on admission ).    Blood pressure 109/83, pulse 85, temperature 98.6 F (37 C), temperature source Oral, resp. rate 20, height 6' (1.829 m), weight 75.297 kg (166 lb), SpO2 100 %.Body mass index is 22.51 kg/(m^2).  See Physician SRA        Has this patient used any form of tobacco in the last 30 days? (Cigarettes, Smokeless Tobacco, Cigars, and/or Pipes) Yes, Prescription provided FOR NICOTINE GUM  Past Medical History:  Past Medical History  Diagnosis Date  . Bipolar 1 disorder   . Schizoaffective disorder   . Chicken pox     Past Surgical History  Procedure Laterality Date  . Lung surgery    . Tonsillectomy    . Myringotomy     Family History:  Family History  Problem Relation  Age of Onset  . Mental illness Sister   . Mental illness Brother    Social History:  History  Alcohol Use  . Yes    Comment: 3-4x a week beer     History  Drug Use  . Yes  . Special: Methamphetamines, Marijuana    Social History   Social History  . Marital Status: Single    Spouse Name: N/A  . Number of Children: N/A  . Years of Education: N/A   Social History Main Topics  . Smoking status: Current Every Day Smoker    Types: Cigarettes  . Smokeless tobacco: None  . Alcohol Use: Yes     Comment: 3-4x a week beer  . Drug Use: Yes    Special: Methamphetamines, Marijuana  . Sexual Activity: Not Asked   Other Topics Concern  . 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 White male, single, Caucasian who presented to [facility] ED accompanied by his fiancee for disorganized behavior.Pt per initial notes in EHR - was feeling agitated ,he had initially gone to [facility] , where he was reportedly called a drug addict , and was told to leave . Pt also was  preaching a lot since the past several days, was kicked out of three different churches for the same , was having grandiose delusions that he was [facility] as well as made threats online about burying the current sheriff next to a deceased man."         Blake Hardin was admitted to the adult unit. He was evaluated and his symptoms were identified. Medication management was discussed and initiated. Patient was started on Haldol for psychosis and given the injectable form prior to discharge to help compliance after leaving the hospital. He was also started on Depakote to  address symptoms of mania and pressured speech. The medication Neurontin 300 mg qid for anxiety and mood lability.He was oriented to the unit and encouraged to participate in unit programming. Medical problems were identified and treated appropriately. Home medication was restarted as needed.        The patient was evaluated each day by a clinical provider to ascertain the patient's response to treatment.  Improvement was noted by the patient's report of decreasing symptoms, improved sleep and appetite, affect, medication tolerance, behavior, and participation in unit programming.  He was asked each day to complete a self inventory noting mood, mental status, pain, new symptoms, anxiety and concerns.         He responded well to medication and being in a therapeutic and supportive environment. Positive and appropriate behavior was noted and the patient was motivated for recovery. The nurses noted that the patient continued to be religiously preoccupied at times but was noted to be improving. He was also reported to make delusional comments about having written books on history and geometry. The patient worked closely with the treatment team and case manager to develop a discharge plan with appropriate goals. Coping skills, problem solving as well as relaxation therapies were also part of the unit programming.         By the day of discharge he was in much improved condition than upon admission.  Symptoms were reported as significantly decreased or resolved completely. The patient denied SI/HI and voiced no AVH. He was motivated to continue taking medication with a goal of continued improvement in mental health.   Blake Hardin was discharged home with a plan to follow up as noted below. He was provided with prescriptions for his psychiatric medications.   Consults:  psychiatry  Significant Diagnostic Studies:  Depakote level, Lipid profile, Elevated Prolactin level, Thyroid panel, UDS  Discharge Vitals:    Blood pressure 109/83, pulse 85, temperature 98.6 F (37 C), temperature source Oral, resp. rate 20, height 6' (1.829 m), weight 75.297 kg (166 lb), SpO2 100 %. Body mass index is 22.51 kg/(m^2). Lab Results:   Results for orders placed or performed during the hospital encounter of 08/13/15 (from the past 72 hour(s))  Valproic acid level     Status: None   Collection Time: 08/20/15  6:30 AM  Result Value Ref Range   Valproic Acid Lvl 51 50.0 - 100.0 ug/mL    Comment: Performed at [facility]    Physical Findings: AIMS: Facial and Oral Movements Muscles of Facial Expression: None, normal Lips and Perioral Area: None, normal Jaw: None, normal Tongue: None, normal,Extremity Movements Upper (arms, wrists, hands, fingers): None, normal Lower (legs, knees, ankles, toes): None, normal, Trunk Movements Neck, shoulders, hips: None, normal, Overall Severity Severity of abnormal movements (highest score from questions above): None, normal Incapacitation due to abnormal movements:  None, normal Patient's awareness of abnormal movements (rate only patient's report): No Awareness, Dental Status Current problems with teeth and/or dentures?: No Does patient usually wear dentures?: No  CIWA:  CIWA-Ar Total: 1 COWS:  COWS Total Score: 2   See Psychiatric Specialty Exam and Suicide Risk Assessment completed by Attending Physician prior to discharge.  Discharge destination:  Home  Is patient on multiple antipsychotic therapies at discharge:  No   Has Patient had three or more failed trials of antipsychotic monotherapy by history:  No  Recommended Plan for Multiple Antipsychotic Therapies: NA      Discharge Instructions    Discharge instructions    Complete by:  As directed   Please have Prolactin level rechecked in three months due to elevation noted during hospital admission.            Medication List    STOP taking these medications        haloperidol 10 MG  tablet  Commonly known as:  HALDOL      TAKE these medications      Indication   divalproex 500 MG 24 hr tablet  Commonly known as:  DEPAKOTE ER  Take 2 tablets (1,000 mg total) by mouth at bedtime.   Indication:  Mood control     gabapentin 300 MG capsule  Commonly known as:  NEURONTIN  Take 1 capsule (300 mg total) by mouth 4 (four) times daily.   Indication:  Agitation     haloperidol decanoate 100 MG/ML injection  Commonly known as:  HALDOL DECANOATE  Inject 0.75 mLs (75 mg total) into the muscle every 30 (thirty) days.  Start taking on:  08/24/2015   Indication:  Psychosis     nicotine polacrilex 2 MG gum  Commonly known as:  NICORETTE  Take 1 each (2 mg total) by mouth as needed for smoking cessation.   Indication:  Nicotine Addiction     traZODone 50 MG tablet  Commonly known as:  DESYREL  Take 1 tablet (50 mg total) by mouth at bedtime as needed for sleep.   Indication:  Trouble Sleeping       Follow-up Information    Follow up with [facility] On 08/22/2015.   Why:  Wed at 8:00 for your hospital follow up appointment   Contact information:   [address]  [place]  [phone]      Follow up with [facility].   Why:  Continue to call Shayla so you can be screened for a bed.     Contact information:   [address]  [place] [phone]       Follow-up recommendations:   Activity: No restrictions Diet: regular Tests: PL level in 3 months - pt did not want a trial of Abilify or seroquel -is comfortable with continuing Haldol decanoate IM as scheduled. Other: follow up with after care  Comments:   Take all your medications as prescribed by your mental healthcare provider.  Report any adverse effects and or reactions from your medicines to your outpatient provider promptly.  Patient is instructed and cautioned to not engage in alcohol and or illegal drug use while on prescription medicines.  In the event of worsening symptoms, patient is instructed  to call the crisis hotline, 911 and or go to the nearest ED for appropriate evaluation and treatment of symptoms.  Follow-up with your primary care provider for your other medical issues, concerns and or health care needs.   Total Discharge  Time: Greater than 30 minutes  Signed: Fransisca Kaufmann, NP-C 08/20/2015, 4:50 PM

## 2015-08-20 NOTE — Progress Notes (Signed)
Pt discharged home with prescriptions. Pt maintained on routine safety checks until discharged.   Discharge instructions and prescriptions reviewed with patient.  All papers and prescriptions were given and valuables returned. Verbal understanding expressed. Denies SI/HI and A/VH. Pt given opportunity to express concerns and ask questions.

## 2015-08-20 NOTE — BHH Counselor (Signed)
Adult Comprehensive Assessment  Patient ID: Blake Hardin, male   DOB: December 26, 1970, 44 y.o.   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 Substance abuse: cannabis regularly, recently was snorting amphetamines  Living/Environment/Situation:  Living Arrangements: Spouse/significant other, Non-relatives/Friends Living conditions (as described by patient or guardian): living in same trailer on same land since 2005 How long has patient lived in current situation?: couple months-since 4/2-met her 25 years ago What is atmosphere in current home: Comfortable, Supportive  Family History:  Does patient have children?: Yes How many children?: 2 How is patient's relationship with their children?: young adults, son will have first grandson this fall  Childhood History:  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"   Names multiple people  Never knew who my real father was Patient's description of current relationship with people who raised him/her: mother is deceased Does patient have siblings?: Yes Number of Siblings: 3 Description of patient's current relationship with siblings: 2 brothers, 1 sister  Says he's close with them Did patient suffer any verbal/emotional/physical/sexual abuse as a child?: No Did patient suffer from severe childhood neglect?: No Has patient ever been sexually abused/assaulted/raped as an adolescent or adult?: No Was the patient ever a victim of a crime or a disaster?: No Witnessed domestic violence?: Yes Has patient been effected by domestic violence as an adult?: No  Education:  Name of school: went to [employer]-got GED there  Employment/Work Situation:   Employment situation: On disability Why is patient on disability: mental health How long has patient been on disability: 2010 Patient's job has been impacted by current  illness: No What is the longest time patient has a held a job?: "erving the  [employer] all my life" Has patient ever been in the [employer]?: No Has patient ever served in Buyer, retail?: No  Financial Resources:   Surveyor, quantity resources: Writer Does patient have a Lawyer or guardian?: No  Alcohol/Substance Abuse:   What has been your use of drugs/alcohol within the last 12 months?: smoking weed, "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 But I don't want to anymore.  I just want to smoke weed and be happy." Has alcohol/substance abuse ever caused legal problems?: Yes  Social Support System:   Patient's Community Support System: Good Describe Community Support System: fiance,"[employer]"  [employer] Type of faith/religion: "I go to every church in town" How does patient's faith help to cope with current illness?: "I'm getting ready to start my own church"  Leisure/Recreation:   Leisure and Hobbies: anything music related  Strengths/Needs:   What things does the patient do well?: conversation In what areas does patient struggle / problems for patient: "I don't struggle"  "I struggle with Doctors."  "I lost my wife to the Dr who delivered my second child>"  Discharge Plan:   Does patient have access to transportation?: Yes Will patient be returning to same living situation after discharge?: Yes Currently receiving community mental health services: Yes (From Whom) ([facility])  Summary/Recommendations:   Summary and Recommendations (to be completed by the evaluator): [name] is a 44 YO caucasian male dually diagnosed with both a severe and persistent mantal illness and recent amphetamine use, severe, dependent.  He has limited insight into either, but  finds himself motivated to get help as he has a girlfriend of 5 months who is demanding that he pull it together,  and he is responding accordingly.  He can beneftir from crises stabilization, medication managment, therapeutic milieu and referral for services.  [name] 08/20/2015

## 2015-08-20 NOTE — BHH Suicide Risk Assessment (Signed)
[place] INPATIENT:  Family/Significant Other Suicide Prevention Education  Suicide Prevention Education:  Education Completed; No one has been identified by the patient as the family member/significant other with whom the patient will be residing, and identified as the person(s) who will aid the patient in the event of a mental health crisis (suicidal ideations/suicide attempt).  With written consent from the patient, the family member/significant other has been provided the following suicide prevention education, prior to the and/or following the discharge of the patient.  The suicide prevention education provided includes the following:  Suicide risk factors  Suicide prevention and interventions  National Suicide Hotline telephone number  [facility] assessment telephone number  [facility] Emergency Assistance [place] and/or Residential Mobile Crisis Unit telephone number  Request made of family/significant other to:  Remove weapons (e.g., guns, rifles, knives), all items previously/currently identified as safety concern.    Remove drugs/medications (over-the-counter, prescriptions, illicit drugs), all items previously/currently identified as a safety concern.  The family member/significant other verbalizes understanding of the suicide prevention education information provided.  The family member/significant other agrees to remove the items of safety concern listed above. The patient did not endorse SI at the time of admission, nor did the patient c/o SI during the stay here.  SPE not required. However, I did talk with his Alecia Lemming, [id number], about a crises plan.  Roque Lias B 08/20/2015, 10:36 AM

## 2015-08-20 NOTE — Tx Team (Signed)
Interdisciplinary Treatment Plan Update (Adult)  Date:  08/20/2015   Time Reviewed:  8:41 AM   Progress in Treatment: Attending groups: Yes. Participating in groups:  Yes. Taking medication as prescribed:  Yes. Tolerating medication:  Yes. Family/Significant othe contact made:  Yes Patient understands diagnosis:  No  Limited insight Discussing patient identified problems/goals with staff:  Yes, see initial care plan. Medical problems stabilized or resolved:  Yes. Denies suicidal/homicidal ideation: Yes. Issues/concerns per patient self-inventory:  No. Other:  New problem(s) identified:  Discharge Plan or Barriers: return home, follow up outpt  Reason for Continuation of Hospitalization:   Comments: " I am fine .'  Objective: Blake Hardin is a 44 y.o. White male, single, Caucasian who presented to [facility] ED accompanied by his fiancee for disorganized behavior. Pt per initial notes in EHR - was feeling agitated ,he had initially gone to [facility] , where he was reportedly called a drug addict , and was told to leave . Pt also was preaching a lot since the past several days, was kicked out of three different churches for the same , was having grandiose delusions that he was going to buy the [facility] as well as made threats online about burying the current sheriff next to a deceased man."   Patient seen this AM. Patient continues to be hyperactive , seen as restless and manic on the unit . Pt focussed on getting discharged from the unit, making threats that he will show his true nature if not discharged by tomorrow.  Depakote, Neurontin, Haldol trial  Estimated length of stay: D/C today  New goal(s):  Review of initial/current patient goals per problem list:   Review of initial/current patient goals per problem list:  1. Goal(s): Patient will participate in aftercare plan   Met: Yes   Target date: 3-5 days post admission date   As evidenced by:  Patient will participate within aftercare plan AEB aftercare provider and housing plan at discharge being identified.  08/20/2015: Plans to return home, follow up outpt    4. Goal(s): Patient will demonstrate decreased signs of withdrawal due to substance abuse   Met: Yes   Target date: 3-5 days post admission date   As evidenced by: Patient will produce a CIWA/COWS score of 0, have stable vitals signs, and no symptoms of withdrawal 08/14/15  No signs nor symptoms of withdrawal today.      5. Goal(s): Patient will demonstrate decreased signs of psychosis  * Met: Yes  * Target date: 3-5 days post admission date  * As evidenced by: Patient will demonstrate decreased frequency of AVH or return to baseline function 08/16/15  Pt is goal directed and less disorganized, but delusions persist 08/20/2015 No signs nor symptoms of psychosis today         Attendees: Patient:  08/20/2015 8:41 AM   Family:   08/20/2015 8:41 AM   Physician:  Ursula Alert, MD 08/20/2015 8:41 AM   Nursing:   Gaylan Gerold, RN 08/20/2015 8:41 AM   CSW:    Roque Lias, [place]   08/20/2015 8:41 AM   Other:  08/20/2015 8:41 AM   Other:   08/20/2015 8:41 AM   Other:  Lars Pinks, Nurse CM 08/20/2015 8:41 AM   Other:  Lucinda Dell, [facility] 08/20/2015 8:41 AM   Other:  Norberto Sorenson, [place]  08/20/2015 8:41 AM   Other:  08/20/2015 8:41 AM   Other:  08/20/2015 8:41 AM   Other:  08/20/2015  8:41 AM   Other:  08/20/2015 8:41 AM   Other:  08/20/2015 8:41 AM   Other:   08/20/2015 8:41 AM    Scribe for Treatment Team:   Trish Mage, 08/20/2015 8:41 AM

## 2015-09-24 DIAGNOSIS — F250 Schizoaffective disorder, bipolar type: Secondary | ICD-10-CM | POA: Diagnosis not present

## 2015-11-15 ENCOUNTER — Encounter (HOSPITAL_COMMUNITY): Payer: Self-pay

## 2015-11-15 ENCOUNTER — Emergency Department (HOSPITAL_COMMUNITY)
Admission: EM | Admit: 2015-11-15 | Discharge: 2015-11-16 | Disposition: A | Discharge status: Other Acute Inpatient Hospital | Payer: Medicare Other | Attending: Emergency Medicine | Admitting: Emergency Medicine

## 2015-11-15 DIAGNOSIS — F3012 Manic episode without psychotic symptoms, moderate: Secondary | ICD-10-CM | POA: Diagnosis not present

## 2015-11-15 DIAGNOSIS — F17210 Nicotine dependence, cigarettes, uncomplicated: Secondary | ICD-10-CM | POA: Diagnosis not present

## 2015-11-15 DIAGNOSIS — Z79899 Other long term (current) drug therapy: Secondary | ICD-10-CM | POA: Insufficient documentation

## 2015-11-15 DIAGNOSIS — F259 Schizoaffective disorder, unspecified: Secondary | ICD-10-CM | POA: Diagnosis not present

## 2015-11-15 DIAGNOSIS — F3112 Bipolar disorder, current episode manic without psychotic features, moderate: Secondary | ICD-10-CM | POA: Insufficient documentation

## 2015-11-15 DIAGNOSIS — Z880 Allergy status to penicillin: Secondary | ICD-10-CM | POA: Insufficient documentation

## 2015-11-15 DIAGNOSIS — R451 Restlessness and agitation: Secondary | ICD-10-CM | POA: Diagnosis present

## 2015-11-15 DIAGNOSIS — Z8619 Personal history of other infectious and parasitic diseases: Secondary | ICD-10-CM | POA: Insufficient documentation

## 2015-11-15 DIAGNOSIS — F419 Anxiety disorder, unspecified: Secondary | ICD-10-CM | POA: Insufficient documentation

## 2015-11-15 DIAGNOSIS — F1210 Cannabis abuse, uncomplicated: Secondary | ICD-10-CM | POA: Diagnosis not present

## 2015-11-15 LAB — RAPID URINE DRUG SCREEN, HOSP PERFORMED
Amphetamines: NOT DETECTED
BARBITURATES: NOT DETECTED
BENZODIAZEPINES: NOT DETECTED
Cocaine: NOT DETECTED
Opiates: NOT DETECTED
Tetrahydrocannabinol: POSITIVE — AB

## 2015-11-15 LAB — COMPREHENSIVE METABOLIC PANEL
ALT: 20 U/L (ref 17–63)
ANION GAP: 8 (ref 5–15)
AST: 24 U/L (ref 15–41)
Albumin: 4.6 g/dL (ref 3.5–5.0)
Alkaline Phosphatase: 55 U/L (ref 38–126)
BUN: 18 mg/dL (ref 6–20)
CALCIUM: 9 mg/dL (ref 8.9–10.3)
CHLORIDE: 104 mmol/L (ref 101–111)
CO2: 26 mmol/L (ref 22–32)
CREATININE: 0.99 mg/dL (ref 0.61–1.24)
GFR calc non Af Amer: >60 mL/min (ref >60)
Glucose, Bld: 92 mg/dL (ref 65–99)
POTASSIUM: 3.9 mmol/L (ref 3.5–5.1)
SODIUM: 138 mmol/L (ref 135–145)
TOTAL PROTEIN: 7.8 g/dL (ref 6.5–8.1)
Total Bilirubin: 0.8 mg/dL (ref 0.3–1.2)

## 2015-11-15 LAB — CBC WITH DIFFERENTIAL/PLATELET
Basophils Absolute: 0.1 10*3/uL (ref 0.0–0.1)
Basophils Relative: 1 %
EOS ABS: 0.1 10*3/uL (ref 0.0–0.7)
EOS PCT: 1 %
HCT: 42.9 % (ref 39.0–52.0)
Hemoglobin: 15.4 g/dL (ref 13.0–17.0)
LYMPHS ABS: 1.4 10*3/uL (ref 0.7–4.0)
Lymphocytes Relative: 23 %
MCH: 32.6 pg (ref 26.0–34.0)
MCHC: 35.9 g/dL (ref 30.0–36.0)
MCV: 90.9 fL (ref 78.0–100.0)
Monocytes Absolute: 0.7 10*3/uL (ref 0.1–1.0)
Monocytes Relative: 12 %
Neutro Abs: 3.7 10*3/uL (ref 1.7–7.7)
Neutrophils Relative %: 63 %
PLATELETS: 258 10*3/uL (ref 150–400)
RBC: 4.72 MIL/uL (ref 4.22–5.81)
RDW: 13 % (ref 11.5–15.5)
WBC: 5.8 10*3/uL (ref 4.0–10.5)

## 2015-11-15 LAB — ETHANOL

## 2015-11-15 LAB — VALPROIC ACID LEVEL

## 2015-11-15 LAB — URINALYSIS, ROUTINE W REFLEX MICROSCOPIC
Bilirubin Urine: NEGATIVE
Glucose, UA: NEGATIVE mg/dL
HGB URINE DIPSTICK: NEGATIVE
Ketones, ur: NEGATIVE mg/dL
NITRITE: NEGATIVE
PROTEIN: NEGATIVE mg/dL
SPECIFIC GRAVITY, URINE: 1.01 (ref 1.005–1.030)
pH: 6.5 (ref 5.0–8.0)

## 2015-11-15 LAB — URINE MICROSCOPIC-ADD ON: RBC / HPF: NONE SEEN RBC/hpf (ref 0–5)

## 2015-11-15 LAB — ACETAMINOPHEN LEVEL

## 2015-11-15 LAB — SALICYLATE LEVEL

## 2015-11-15 MED ORDER — LORAZEPAM 1 MG PO TABS
0.0000 mg | ORAL_TABLET | Freq: Four times a day (QID) | ORAL | Status: DC
  Administered 2015-11-15: 2 mg via ORAL
  Filled 2015-11-15: qty 2

## 2015-11-15 MED ORDER — DIVALPROEX SODIUM ER 500 MG PO TB24: 1000.0000 mg | ORAL_TABLET | Freq: Every day | ORAL | Status: DC

## 2015-11-15 MED ORDER — STERILE WATER FOR INJECTION IJ SOLN
INTRAMUSCULAR | Status: AC
  Administered 2015-11-15: 19:00:00
  Filled 2015-11-15: qty 10

## 2015-11-15 MED ORDER — ZIPRASIDONE MESYLATE 20 MG IM SOLR
10.0000 mg | Freq: Once | INTRAMUSCULAR | Status: AC
  Administered 2015-11-15: 10 mg via INTRAMUSCULAR
  Filled 2015-11-15: qty 20

## 2015-11-15 MED ORDER — ONDANSETRON HCL 4 MG PO TABS: 4.0000 mg | ORAL_TABLET | Freq: Three times a day (TID) | ORAL | Status: DC | PRN

## 2015-11-15 MED ORDER — TRAZODONE HCL 50 MG PO TABS: 50.0000 mg | ORAL_TABLET | Freq: Every evening | ORAL | Status: DC | PRN

## 2015-11-15 MED ORDER — LORAZEPAM 1 MG PO TABS: 0.0000 mg | ORAL_TABLET | Freq: Two times a day (BID) | ORAL | Status: DC

## 2015-11-15 MED ORDER — VITAMIN B-1 100 MG PO TABS
100.0000 mg | ORAL_TABLET | Freq: Every day | ORAL | Status: DC
  Administered 2015-11-15: 100 mg via ORAL
  Filled 2015-11-15: qty 1

## 2015-11-15 MED ORDER — NICOTINE 21 MG/24HR TD PT24
21.0000 mg | MEDICATED_PATCH | Freq: Every day | TRANSDERMAL | Status: DC
  Administered 2015-11-15: 21 mg via TRANSDERMAL
  Filled 2015-11-15: qty 1

## 2015-11-15 MED ORDER — THIAMINE HCL 100 MG/ML IJ SOLN: 100.0000 mg | Freq: Every day | INTRAMUSCULAR | Status: DC

## 2015-11-15 MED ORDER — GABAPENTIN 400 MG PO CAPS
800.0000 mg | ORAL_CAPSULE | Freq: Four times a day (QID) | ORAL | Status: DC
  Administered 2015-11-15 (×2): 800 mg via ORAL
  Filled 2015-11-15 (×2): qty 2

## 2015-11-15 NOTE — ED Notes (Signed)
Pt yelling and cursing in room. Pt upset about being recommended for inpatient treatment. Pt arms cuffed to bed by [employer]. Pt continues to yell and curse. Pt knocked drink in floor and onto computer. [employer] and security at bedside.

## 2015-11-15 NOTE — ED Notes (Signed)
Pt resting quietly.  Respirations regular, even and unlabored.    

## 2015-11-15 NOTE — ED Notes (Addendum)
Pt given Sprite to drink, reminded to provide urine specimen.

## 2015-11-15 NOTE — ED Notes (Addendum)
Pt continues to yell and curse and attempt to break stretcher. EDP at bedside. Geodon ordered and given.

## 2015-11-15 NOTE — ED Provider Notes (Signed)
CSN: [id number]     Arrival date & time 11/15/15  1122 History  By signing my name below, I, Meriel Pica, attest that this documentation has been prepared under the direction and in the presence of Ezequiel Essex, MD. Electronically Signed: Meriel Pica, ED Scribe. 11/15/2015. 11:44 AM.   Chief Complaint  Patient presents with  . V70.1   The history is provided by the patient and the police. No language interpreter was used.   HPI Comments: Shannon Soderman Clere is a 44 y.o. male, with a h/o bipolar disorder and schizoaffective, who presents to the Emergency Department IVC with police escort for evaluation of AMS. Pt states he has a penny from heaven that is worth a million dollars. He has a h/o behavioral health admission. He admits to marijuana use with last use being yesterday but denies any cocaine or EtOH use. Pt takes haloperidol and Depakote daily. He reports his PCP, Dr. Jeanell Sparrow forgot to call in his Valium and Neurontin medication. Pt denies any pain, headache, chest pain, abdominal pain, or SI/HI.   Past Medical History  Diagnosis Date  . Bipolar 1 disorder ([place])   . Schizoaffective disorder   . Chicken pox    Past Surgical History  Procedure Laterality Date  . Lung surgery    . Tonsillectomy    . Myringotomy     Family History  Problem Relation Age of Onset  . Mental illness Sister   . Mental illness Brother    Social History  Substance Use Topics  . Smoking status: Current Every Day Smoker    Types: Cigarettes  . Smokeless tobacco: None  . Alcohol Use: Yes     Comment: 3-4x a week beer    Review of Systems  Constitutional: Negative for fever.  Cardiovascular: Negative for chest pain.  Gastrointestinal: Negative for abdominal pain.  Musculoskeletal: Negative for myalgias, back pain and arthralgias.  Neurological: Negative for headaches.  Psychiatric/Behavioral: Positive for agitation. Negative for suicidal ideas, hallucinations and self-injury. The patient is  nervous/anxious.   A complete 10 system review of systems was obtained and is otherwise negative except at noted in the HPI and PMH.  Allergies  Coconut fatty acids; Onion; and Penicillins  Home Medications   Prior to Admission medications   Medication Sig Start Date End Date Taking? Authorizing Provider  divalproex (DEPAKOTE ER) 500 MG 24 hr tablet Take 2 tablets (1,000 mg total) by mouth at bedtime. 08/20/15  Yes Niel Hummer, NP  gabapentin (NEURONTIN) 800 MG tablet Take 800 mg by mouth 4 (four) times daily.   Yes Historical Provider, MD  haloperidol decanoate (HALDOL DECANOATE) 100 MG/ML injection Inject 0.75 mLs (75 mg total) into the muscle every 30 (thirty) days. 08/24/15  Yes Niel Hummer, NP  traZODone (DESYREL) 50 MG tablet Take 1 tablet (50 mg total) by mouth at bedtime as needed for sleep. Patient taking differently: Take 100 mg by mouth at bedtime as needed for sleep.  08/20/15  Yes Niel Hummer, NP  gabapentin (NEURONTIN) 300 MG capsule Take 1 capsule (300 mg total) by mouth 4 (four) times daily. Patient not taking: Reported on 11/15/2015 08/20/15   Niel Hummer, NP  nicotine polacrilex (NICORETTE) 2 MG gum Take 1 each (2 mg total) by mouth as needed for smoking cessation. Patient not taking: Reported on 11/15/2015 08/20/15   Niel Hummer, NP   BP 137/100 mmHg  Pulse 96  Temp(Src) 98 F (36.7 C) (Oral)  Resp 20  Ht  6' (1.829 m)  Wt 160 lb (72.576 kg)  BMI 21.70 kg/m2  SpO2 99% Physical Exam  Constitutional: He is oriented to person, place, and time. He appears well-developed and well-nourished. No distress.  HENT:  Head: Normocephalic and atraumatic.  Mouth/Throat: Oropharynx is clear and moist. No oropharyngeal exudate.  Eyes: Conjunctivae and EOM are normal. Pupils are equal, round, and reactive to light.  Neck: Normal range of motion. Neck supple.  No meningismus.  Cardiovascular: Normal rate, regular rhythm, normal heart sounds and intact distal pulses.   No  murmur heard. Pulmonary/Chest: Effort normal and breath sounds normal. No respiratory distress.  Abdominal: Soft. There is no tenderness. There is no rebound and no guarding.  Musculoskeletal: Normal range of motion. He exhibits no edema or tenderness.  Neurological: He is alert and oriented to person, place, and time. No cranial nerve deficit. He exhibits normal muscle tone. Coordination normal.  No ataxia on finger to nose bilaterally. No pronator drift. 5/5 strength throughout. CN 2-12 intact.Equal grip strength. Sensation intact.   Skin: Skin is warm.  Psychiatric:  Flight of ideas, rapid speech  Nursing note and vitals reviewed.   ED Course  Procedures  DIAGNOSTIC STUDIES: Oxygen Saturation is 99% on RA, normal by my interpretation.    COORDINATION OF CARE: 11:43 AM Discussed treatment plan with pt. Pt acknowledges and agrees to plan.   Labs Review Labs Reviewed  ACETAMINOPHEN LEVEL - Abnormal; Notable for the following:    Acetaminophen (Tylenol), Serum <10 (*)    All other components within normal limits  VALPROIC ACID LEVEL - Abnormal; Notable for the following:    Valproic Acid Lvl <10 (*)    All other components within normal limits  CBC WITH DIFFERENTIAL/PLATELET  COMPREHENSIVE METABOLIC PANEL  ETHANOL  SALICYLATE LEVEL  URINE RAPID DRUG SCREEN, HOSP PERFORMED  URINALYSIS, ROUTINE W REFLEX MICROSCOPIC (NOT AT [facility])  I have personally reviewed and evaluated these lab results as part of my medical decision-making.   MDM   Final diagnoses:  Bipolar affective disorder, currently manic, moderate ([place])   patient brought in by [facility] with tangential speech, flight of ideas, delusions of grandeur. He states he is a god and has a penny worth $1 million. He denies any suicidal or homicidal thoughts. He denies any alcohol or drug use. He is oriented 3.  IVC paperwork completed. Laboratory studies are reassuring. Patient is medically clear for psychiatric evaluation. He  does meet inpatient criteria. Holding orders are placed.  I personally performed the services described in this documentation, which was scribed in my presence. The recorded information has been reviewed and is accurate.    Ezequiel Essex, MD 11/15/15 1539

## 2015-11-15 NOTE — BH Assessment (Addendum)
Tele Assessment Note   Blake Hardin is an 44 y.o. male. The Pt denies SI/HI. Pt denies AVH but is actively psychotic. Pt's speech was tangential. Pt's had flight of ideas. Pt was hyper-religious. According to the Pt, he found a penny from heaven that is sacred to him. He states that the penny is worth a million dollars. Pt discussed being a Facilities manager, Engineer, agricultural, and a Administrator, sports. Pt reports 20+ hospitalizations. Pt reports current mental health treatment at [facility]. The Pt is currently receiving the Hadol shot monthly. Pt is also prescribed Depakote. Pt states he needs an ACTT team but cannot afford it. Pt reports occasional marijuana use. Pt denies alcohol use.   Writer consulted with May, NP. Per May, NP Pt meets inpatient criteria. TTS to seek placement.  Diagnosis:  F25.0 Schizoaffective disorder, Bipolar type  Past Medical History:  Past Medical History  Diagnosis Date  . Bipolar 1 disorder ([place])   . Schizoaffective disorder   . Chicken pox     Past Surgical History  Procedure Laterality Date  . Lung surgery    . Tonsillectomy    . Myringotomy      Family History:  Family History  Problem Relation Age of Onset  . Mental illness Sister   . Mental illness Brother     Social History:  reports that he has been smoking Cigarettes.  He does not have any smokeless tobacco history on file. He reports that he drinks alcohol. He reports that he uses illicit drugs (Methamphetamines and Marijuana).  Additional Social History:  Alcohol / Drug Use Pain Medications: pt denies Prescriptions: Haldol Over the Counter: Pt denies Longest period of sobriety (when/how long): unknown Substance #1 Name of Substance 1: alcohol 1 - Age of First Use: unknown 1 - Amount (size/oz): unknown 1 - Frequency: unknown 1 - Duration: unknown 1 - Last Use / Amount: unknown Substance #2 Name of Substance 2: marijuana 2 - Age of First Use: unknown 2 - Amount (size/oz): unknown 2 -  Frequency: unknown 2 - Duration: unknown 2 - Last Use / Amount: unknown  CIWA: CIWA-Ar BP: 137/100 mmHg Pulse Rate: 96 COWS:    PATIENT STRENGTHS: (choose at least two) Active sense of humor Communication skills  Allergies:  Allergies  Allergen Reactions  . Coconut Fatty Acids   . Onion   . Penicillins Other (See Comments)    Reaction: unknown Childhood allergy    Home Medications:  (Not in a hospital admission)  OB/GYN Status:  No LMP for male patient.  General Assessment Data Location of Assessment: [facility] ED TTS Assessment: In system Is this a Tele or Face-to-Face Assessment?: Tele Assessment Is this an Initial Assessment or a Re-assessment for this encounter?: Initial Assessment Marital status: Single Maiden name: NA Is patient pregnant?: No Pregnancy Status: No Living Arrangements: Spouse/significant other, Non-relatives/Friends Can pt return to current living arrangement?: Yes Admission Status: Involuntary Is patient capable of signing voluntary admission?: No Referral Source: Self/Family/Friend Insurance type: Medicare     Crisis Care Plan Living Arrangements: Spouse/significant other, Non-relatives/Friends Name of Psychiatrist: NA Name of Therapist: NA  Education Status Is patient currently in school?: No  Risk to self with the past 6 months Suicidal Ideation: No Has patient been a risk to self within the past 6 months prior to admission? : No Suicidal Intent: No Has patient had any suicidal intent within the past 6 months prior to admission? : No Is patient at risk for suicide?: No Suicidal Plan?: No  Has patient had any suicidal plan within the past 6 months prior to admission? : No Access to Means: No What has been your use of drugs/alcohol within the last 12 months?: Marijuana Previous Attempts/Gestures: No How many times?: 0 Other Self Harm Risks: NA Triggers for Past Attempts: None known Intentional Self Injurious Behavior: None Family  Suicide History: No Recent stressful life event(s): Other (Comment) (unknown) Persecutory voices/beliefs?: No Depression: Yes Depression Symptoms: Guilt, Tearfulness Substance abuse history and/or treatment for substance abuse?: Yes Suicide prevention information given to non-admitted patients: Not applicable  Risk to Others within the past 6 months Homicidal Ideation: No Does patient have any lifetime risk of violence toward others beyond the six months prior to admission? : No Thoughts of Harm to Others: No Current Homicidal Intent: No Current Homicidal Plan: No Access to Homicidal Means: No Identified Victim: NA History of harm to others?: No Assessment of Violence: None Noted Violent Behavior Description: NA Does patient have access to weapons?: No Criminal Charges Pending?: Yes Describe Pending Criminal Charges: 12/09/15 Does patient have a court date: No Is patient on probation?: No  Psychosis Hallucinations: Visual Delusions: None noted  Mental Status Report Appearance/Hygiene: Disheveled, In scrubs Eye Contact: Good Motor Activity: Freedom of movement Speech: Tangential Level of Consciousness: Alert Mood: Euphoric Affect: Other (Comment) (euphoric) Anxiety Level: None Thought Processes: Flight of Ideas, Tangential Judgement: Impaired Orientation: Person, Place, Time Obsessive Compulsive Thoughts/Behaviors: None  Cognitive Functioning Concentration: Normal Memory: Recent Impaired, Remote Impaired IQ: Average Insight: Poor Impulse Control: Poor Appetite: Fair Weight Loss: 0 Weight Gain: 0 Sleep: Decreased Total Hours of Sleep: 6 Vegetative Symptoms: None  ADLScreening [facility] Assessment Services) Patient's cognitive ability adequate to safely complete daily activities?: Yes Patient able to express need for assistance with ADLs?: Yes Independently performs ADLs?: Yes (appropriate for developmental age)  Prior Inpatient Therapy Prior Inpatient Therapy:  Yes Prior Therapy Dates: 2016 Prior Therapy Facilty/Provider(s): [facility] Reason for Treatment: Schizoaffective  Prior Outpatient Therapy Prior Outpatient Therapy: Yes Prior Therapy Dates: 2016 Prior Therapy Facilty/Provider(s): [facility] Reason for Treatment: Schizoaffective Does patient have an ACCT team?: No Does patient have Intensive In-House Services?  : No Does patient have [facility] services? : No Does patient have P4CC services?: No  ADL Screening (condition at time of admission) Patient's cognitive ability adequate to safely complete daily activities?: Yes Is the patient deaf or have difficulty hearing?: No Does the patient have difficulty seeing, even when wearing glasses/contacts?: No Does the patient have difficulty concentrating, remembering, or making decisions?: No Patient able to express need for assistance with ADLs?: Yes Does the patient have difficulty dressing or bathing?: No Independently performs ADLs?: Yes (appropriate for developmental age) Does the patient have difficulty walking or climbing stairs?: No Weakness of Legs: None Weakness of Arms/Hands: None       Abuse/Neglect Assessment (Assessment to be complete while patient is alone) Physical Abuse: Denies Verbal Abuse: Denies Sexual Abuse: Denies Exploitation of patient/patient's resources: Denies Self-Neglect: Denies Values / Beliefs Cultural Requests During Hospitalization: None Spiritual Requests During Hospitalization: None   Advance Directives (For Healthcare) Does patient have an advance directive?: No Would patient like information on creating an advanced directive?: No - patient declined information    Additional Information 1:1 In Past 12 Months?: No CIRT Risk: No Elopement Risk: No Does patient have medical clearance?: No     Disposition:  Disposition Initial Assessment Completed for this Encounter: Yes  Tai Syfert D 11/15/2015 1:58 PM

## 2015-11-15 NOTE — ED Notes (Signed)
Pt brought here by officer for psych evaluation. Pt states he is God today and everybody else is God. Pt also, states he has a penny that is worth a million dollars and he bets Daisy Floro will buy it from him. Pt is talking non stop. States he does not want to get in scrubs and he doesn't want any needles in his arms.

## 2015-11-15 NOTE — Progress Notes (Addendum)
Patient was referred for inpatient treatment at: [facility] - per intake, fax referral for review. [facility] - per Marlou Sa, fax it. [facility] - per  [place], fax referral. [facility] per Izora Gala, fax referral for review. [facility] - per intake, fax referral for the waitlist. [facility] - per Olivia Mackie, fax referral, couple of adult beds tonight. [facility] - left voicemail. [facility] - faxed referral.  Declined at: [facility] - due chronicity  CSW will continue to seek placement.  Verlon Setting, [place] Disposition staff 11/15/2015 10:28 PM

## 2015-11-16 DIAGNOSIS — F259 Schizoaffective disorder, unspecified: Secondary | ICD-10-CM | POA: Diagnosis not present

## 2015-11-16 DIAGNOSIS — F312 Bipolar disorder, current episode manic severe with psychotic features: Secondary | ICD-10-CM | POA: Diagnosis not present

## 2015-11-16 DIAGNOSIS — F17210 Nicotine dependence, cigarettes, uncomplicated: Secondary | ICD-10-CM | POA: Diagnosis present

## 2015-11-16 NOTE — ED Notes (Signed)
[facility] informed of delay on transport.

## 2015-11-16 NOTE — ED Provider Notes (Signed)
Patient accepted to [facility] by Dr. Eugenio Hoes.  Appears stable for transfer.  BP 115/74 mmHg  Pulse 82  Temp(Src) 98.7 F (37.1 C) (Oral)  Resp 16  Ht 6' (1.829 m)  Wt 160 lb (72.576 kg)  BMI 21.70 kg/m2  SpO2 97%   Ezequiel Essex, MD 11/16/15 [phone]

## 2015-11-16 NOTE — Progress Notes (Signed)
Per York Cerise at [facility], Dr. Eugenio Hoes has accepted pt for admission and pt can arrive anytime. Report can be called at [phone]. Pt is under IVC.  Spoke with [employer] re: pt's pending transfer.  Sharren Bridge, MSW, LCSW Clinical Social Work, Disposition  11/16/2015 [phone]

## 2015-11-16 NOTE — ED Notes (Signed)
Blake Hardin from [facility] requesting to know what type of legal charges pt has pending. Information give to pt's nurse to follow up on.

## 2015-11-16 NOTE — ED Notes (Signed)
Pt served papers and transferred by [employer] to [facility].

## 2015-11-16 NOTE — ED Notes (Signed)
Officer given pts personal belongings.

## 2015-11-16 NOTE — BHH Counselor (Signed)
Received message from [facility], Clayborne Dana from [facility] regarding status of medical clearance for pt. Called nurse Jeanett Schlein at [facility] and inquired.  Nurse sts that pt is medically cleared.  [facility] and spoke with [name] advising that pt is medically cleared.  Advised [name] of Jeanette's name and [employer] number for any additional information needed.  Faylene Kurtz, MS, CRC, [place] Triage Specialist [facility]

## 2015-11-16 NOTE — ED Notes (Signed)
Spoke with [name] at [facility].  Pt has been accepted.  May be transported after 9am.

## 2015-11-16 NOTE — ED Notes (Signed)
Pt ate 100% of breakfast and no am medications given prior to departure.  Pt is cuffed to the stretcher during mealtime.  Engineer, structural at bedside.

## 2015-12-05 DIAGNOSIS — F250 Schizoaffective disorder, bipolar type: Secondary | ICD-10-CM | POA: Diagnosis not present

## 2016-01-01 DIAGNOSIS — F250 Schizoaffective disorder, bipolar type: Secondary | ICD-10-CM | POA: Diagnosis not present

## 2016-01-15 DIAGNOSIS — F250 Schizoaffective disorder, bipolar type: Secondary | ICD-10-CM | POA: Diagnosis not present

## 2016-01-15 DIAGNOSIS — Z79899 Other long term (current) drug therapy: Secondary | ICD-10-CM | POA: Diagnosis not present

## 2016-01-28 DIAGNOSIS — F250 Schizoaffective disorder, bipolar type: Secondary | ICD-10-CM | POA: Diagnosis not present

## 2016-02-19 ENCOUNTER — Encounter: Payer: Self-pay | Admitting: Family Medicine

## 2016-02-19 DIAGNOSIS — F419 Anxiety disorder, unspecified: Secondary | ICD-10-CM | POA: Insufficient documentation

## 2016-02-19 DIAGNOSIS — R011 Cardiac murmur, unspecified: Secondary | ICD-10-CM | POA: Insufficient documentation

## 2016-03-03 ENCOUNTER — Encounter: Payer: Self-pay | Admitting: Family Medicine

## 2016-03-03 ENCOUNTER — Ambulatory Visit (INDEPENDENT_AMBULATORY_CARE_PROVIDER_SITE_OTHER): Payer: Medicare Other | Admitting: Family Medicine

## 2016-03-03 VITALS — BP 108/68 | HR 76 | Temp 97.8°F | Resp 14 | Ht 72.0 in | Wt 169.0 lb

## 2016-03-03 DIAGNOSIS — Z13220 Encounter for screening for lipoid disorders: Secondary | ICD-10-CM | POA: Diagnosis not present

## 2016-03-03 DIAGNOSIS — F250 Schizoaffective disorder, bipolar type: Secondary | ICD-10-CM | POA: Diagnosis not present

## 2016-03-03 DIAGNOSIS — K921 Melena: Secondary | ICD-10-CM | POA: Diagnosis not present

## 2016-03-03 DIAGNOSIS — Z7189 Other specified counseling: Secondary | ICD-10-CM

## 2016-03-03 DIAGNOSIS — Z7689 Persons encountering health services in other specified circumstances: Secondary | ICD-10-CM

## 2016-03-03 MED ORDER — DIAZEPAM 5 MG PO TABS: 5.0000 mg | ORAL_TABLET | Freq: Two times a day (BID) | ORAL | Status: DC | PRN

## 2016-03-03 NOTE — Progress Notes (Signed)
Subjective:    Patient ID: Blake Hardin, male    DOB: 1971-11-02, 45 y.o.   MRN: [id number]  HPI Patient is a 45 year old white male who is here today to establish care. He has a history of schizoaffective disorder bipolar type. He is currently managed at mental health. He was previously using Valium 5 mg tablets 1 every other day to help with anxiety. He states that whenever he starts to become hyper and "manic" he can take a 5 mg IM to help calm him down and prevent him from spiraling out of control. He admits that in the past he was taking high doses of Valium that his mental health provider wean him off of that. However now his mental health provider will not prescribe any controlled substances as recommended that he get any future controlled substances from his primary care physician.  Patient seems very anxious today. He is very pleasant but she can tell he is very anxious and in the doctor's office. I do not believe that one Valium every other day presents a problem. The patient however is very honest and admits to smoking marijuana regularly. 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 However he states that he has been clean for more than a year. We had a long discussion today about controlled substances. I was very clear with the patient that as long as he is not abusing the medication, asking for prescriptions repeatedly early, or abusing controlled substances I will continue to prescribe this medication. However I would ask that 30 pills last 2 months. Her urine drug screen I would be willing to overlook marijuana however any other controlled substances or illegal substances would constitute a violation of his drug contract with me and I would no longer prescribe by. He is willing to accept these terms. He also reports occasional trace blood in his stool. He admits to having 3 or 4 bowel movements every morning. After frequent wiping, he will see a trace  spot of blood on the toilet tissue. On rectal exam today, the patient has 3 external hemorrhoids. One is located at approximately 1:00, one is located at approximately 10:00, and one is located at approximately 5:00, with the patient leaning over the exam table standing. None is bleeding today but these could certainly be the source of bleeding with irritation. On digital rectal exam there are no rectal masses and his prostate feels normal. Past Medical History  Diagnosis Date  . Bipolar 1 disorder (HCC)   . Schizoaffective disorder   . Chicken pox   . Substance abuse     from 1990-2012  . Anxiety   . Heart murmur   . Allergy    Past Surgical History  Procedure Laterality Date  . Lung surgery    . Tonsillectomy    . Myringotomy     Current Outpatient Prescriptions on File Prior to Visit  Medication Sig Dispense Refill  . divalproex (DEPAKOTE ER) 500 MG 24 hr tablet Take 2 tablets (1,000 mg total) by mouth at bedtime. (Patient taking differently: Take 1,500 mg by mouth at bedtime. ) 60 tablet 0  . gabapentin (NEURONTIN) 800 MG tablet Take 800 mg by mouth 4 (four) times daily.    . traZODone (DESYREL) 50 MG tablet Take 1 tablet (50 mg total) by mouth at bedtime as needed for sleep. (Patient taking differently: Take 100 mg by mouth at bedtime as needed for sleep. ) 30 tablet 0  .  diazepam (VALIUM) 5 MG tablet Take 5 mg by mouth as needed for anxiety. Reported on 03/03/2016    . haloperidol decanoate (HALDOL DECANOATE) 100 MG/ML injection Inject 0.75 mLs (75 mg total) into the muscle every 30 (thirty) days. (Patient taking differently: Inject 150 mg into the muscle every 30 (thirty) days. ) 1 mL 0   No current facility-administered medications on file prior to visit.   Allergies  Allergen Reactions  . Coconut Fatty Acids   . Onion   . Penicillins Other (See Comments)    Reaction: unknown Childhood allergy   Social History   Social History  . Marital Status: Single    Spouse Name:  N/A  . Number of Children: N/A  . Years of Education: N/A   Occupational History  . Not on file.   Social History Main Topics  . Smoking status: Current Every Day Smoker -- 1.50 packs/day    Types: Cigarettes  . Smokeless tobacco: Never Used  . Alcohol Use: Yes     Comment: 3-4x a week beer  . Drug Use: Yes    Special: Methamphetamines, Marijuana  . Sexual Activity: Yes    Birth Control/ Protection: None   Other Topics Concern  . Not on file   Social History Narrative   Family History  Problem Relation Age of Onset  . Mental illness Sister   . Depression Sister   . Arthritis Mother   . Asthma Mother   . Cancer Mother   . Cancer Maternal Grandmother   . Mental illness Maternal Grandmother   . Mental retardation Cousin       Review of Systems  All other systems reviewed and are negative.      Objective:   Physical Exam  Constitutional: He is oriented to person, place, and time. He appears well-developed and well-nourished. No distress.  HENT:  Head: Normocephalic and atraumatic.  Right Ear: External ear normal.  Left Ear: External ear normal.  Nose: Nose normal.  Mouth/Throat: Oropharynx is clear and moist. No oropharyngeal exudate.  Eyes: Conjunctivae and EOM are normal. Pupils are equal, round, and reactive to light. Right eye exhibits no discharge. Left eye exhibits no discharge. No scleral icterus.  Neck: Normal range of motion. Neck supple. No JVD present. No tracheal deviation present. No thyromegaly present.  Cardiovascular: Normal rate, regular rhythm and normal heart sounds.  Exam reveals no gallop and no friction rub.   No murmur heard. Pulmonary/Chest: Effort normal and breath sounds normal. No respiratory distress. He has no wheezes. He has no rales. He exhibits no tenderness.  Abdominal: Soft. Bowel sounds are normal. He exhibits no distension. There is no tenderness. There is no rebound and no guarding.  Genitourinary: Prostate normal. Rectal exam  shows external hemorrhoid. Rectal exam shows no mass, no tenderness and anal tone normal.  Musculoskeletal: Normal range of motion. He exhibits no edema.  Lymphadenopathy:    He has no cervical adenopathy.  Neurological: He is alert and oriented to person, place, and time. He has normal reflexes. He displays normal reflexes. No cranial nerve deficit. He exhibits normal muscle tone. Coordination normal.  Skin: Skin is warm. No rash noted. He is not diaphoretic. No erythema. No pallor.  Psychiatric: His speech is normal and behavior is normal. Judgment and thought content normal. His mood appears anxious. Cognition and memory are normal.  Vitals reviewed.         Assessment & Plan:  Schizoaffective disorder, bipolar type (HCC) - Plan: diazepam (VALIUM)  5 MG tablet  Establishing care with new doctor, encounter for - Plan: CBC with Differential/Platelet, COMPLETE METABOLIC PANEL WITH GFR, Lipid panel  Blood in stool  Screening cholesterol level - Plan: CBC with Differential/Platelet, COMPLETE METABOLIC PANEL WITH GFR, Lipid panel  I believe the external hemorrhoids are the likely source of the bleeding particularly if he wipes them repeatedly every day. However to be certain, I will sign the patient out for Cologuard to rule out any internal malignancy out of the reach of a digital rectal exam. His blood pressure is excellent today. I did recommend smoking cessation. I would like the patient to return for a CBC, CMP, and fasting lipid panel. I am willing to give the patient 30 Valium, 5 mg tablets, every 30-60 days. I would prefer them to last 2 months. I was adamant that the patient is caught abusing illicit drugs such as methamphetamine cocaine or anything other than marijuana, I would discontinue Valium. To ensure compliance, I will check a urine drug screen at his next appointment. Patient declines a flu shot. He declines a tetanus shot.

## 2016-04-07 ENCOUNTER — Telehealth: Payer: Self-pay | Admitting: General Practice

## 2016-04-07 DIAGNOSIS — F250 Schizoaffective disorder, bipolar type: Secondary | ICD-10-CM

## 2016-04-07 NOTE — Telephone Encounter (Signed)
ok 

## 2016-04-07 NOTE — Telephone Encounter (Signed)
Ok to refill 

## 2016-04-07 NOTE — Telephone Encounter (Signed)
Pt is requesting a refill of Diazepam  [phone]

## 2016-04-08 ENCOUNTER — Other Ambulatory Visit: Payer: Self-pay | Admitting: Family Medicine

## 2016-04-08 MED ORDER — DIAZEPAM 5 MG PO TABS: 5.0000 mg | ORAL_TABLET | Freq: Two times a day (BID) | ORAL | Status: DC | PRN

## 2016-04-08 NOTE — Telephone Encounter (Signed)
Medication called/sent to requested pharmacy  

## 2016-04-28 DIAGNOSIS — F250 Schizoaffective disorder, bipolar type: Secondary | ICD-10-CM | POA: Diagnosis not present

## 2016-05-02 ENCOUNTER — Telehealth: Payer: Self-pay | Admitting: Family Medicine

## 2016-05-02 NOTE — Telephone Encounter (Signed)
Colorguard test kit was sent to pt and they have had no response in 60 days they have suspended the test  - test can be reactivated if pt sends samples back.   Tried to call patient - line busy.  

## 2016-05-12 ENCOUNTER — Other Ambulatory Visit: Payer: Self-pay | Admitting: Family Medicine

## 2016-05-12 NOTE — Telephone Encounter (Signed)
Okay to refill? 

## 2016-05-12 NOTE — Telephone Encounter (Signed)
Ok to refill??  Last office visit 03/03/2016.  Last refill 04/08/2016.

## 2016-05-12 NOTE — Telephone Encounter (Signed)
Medication called to pharmacy. 

## 2016-05-13 ENCOUNTER — Other Ambulatory Visit: Payer: Self-pay | Admitting: Family Medicine

## 2016-05-14 NOTE — Telephone Encounter (Signed)
Valium refill request. Called in per [facility] 05/12/2016.

## 2016-06-09 ENCOUNTER — Other Ambulatory Visit: Payer: Self-pay | Admitting: Family Medicine

## 2016-06-09 NOTE — Telephone Encounter (Signed)
Ok to refill??  Last office visit 03/03/2016.  Last refill 05/12/2016.

## 2016-06-09 NOTE — Telephone Encounter (Signed)
Medication called to pharmacy. 

## 2016-06-09 NOTE — Telephone Encounter (Signed)
ok 

## 2016-06-11 ENCOUNTER — Other Ambulatory Visit: Payer: Self-pay | Admitting: Family Medicine

## 2016-06-11 NOTE — Telephone Encounter (Signed)
Refill denied.   Medication called in on 06/09/2016.

## 2016-07-09 ENCOUNTER — Other Ambulatory Visit: Payer: Self-pay | Admitting: Family Medicine

## 2016-07-10 NOTE — Telephone Encounter (Signed)
Ok to refill??  Last office visit 03/03/2016.  Last refill 06/09/2016.

## 2016-07-10 NOTE — Telephone Encounter (Signed)
Medication called to pharmacy. 

## 2016-07-10 NOTE — Telephone Encounter (Signed)
ok 

## 2016-07-21 DIAGNOSIS — F250 Schizoaffective disorder, bipolar type: Secondary | ICD-10-CM | POA: Diagnosis not present

## 2016-08-08 ENCOUNTER — Other Ambulatory Visit: Payer: Self-pay | Admitting: Family Medicine

## 2016-08-12 NOTE — Telephone Encounter (Signed)
ok 

## 2016-08-12 NOTE — Telephone Encounter (Signed)
Ok to refill??  Last office visit 03/03/2016.  Last refill 07/10/2016.

## 2016-09-08 ENCOUNTER — Other Ambulatory Visit: Payer: Self-pay | Admitting: Family Medicine

## 2016-09-08 NOTE — Telephone Encounter (Signed)
Ok to refill 

## 2016-09-08 NOTE — Telephone Encounter (Signed)
ok 

## 2016-09-08 NOTE — Telephone Encounter (Signed)
Medication called to pharmacy. 

## 2016-10-20 ENCOUNTER — Telehealth: Payer: Self-pay | Admitting: Family Medicine

## 2016-10-20 DIAGNOSIS — F250 Schizoaffective disorder, bipolar type: Secondary | ICD-10-CM | POA: Diagnosis not present

## 2016-10-20 MED ORDER — DIAZEPAM 5 MG PO TABS: 5.0000 mg | ORAL_TABLET | Freq: Two times a day (BID) | ORAL | 0 refills | Status: DC | PRN

## 2016-10-20 NOTE — Telephone Encounter (Signed)
Reviewed LOV note.  Approved. # [phone] + 0

## 2016-10-20 NOTE — Telephone Encounter (Signed)
Requesting refill on Valium LRF 09/08/16 LOV 03/03/16 - Ok to refill??

## 2016-10-20 NOTE — Telephone Encounter (Signed)
Medication called/sent to requested pharmacy  

## 2016-10-28 ENCOUNTER — Ambulatory Visit (INDEPENDENT_AMBULATORY_CARE_PROVIDER_SITE_OTHER): Payer: Medicare Other | Admitting: Family Medicine

## 2016-10-28 ENCOUNTER — Encounter: Payer: Self-pay | Admitting: Family Medicine

## 2016-10-28 VITALS — BP 130/90 | HR 72 | Temp 97.5°F | Resp 20 | Ht 71.0 in | Wt 174.0 lb

## 2016-10-28 DIAGNOSIS — J441 Chronic obstructive pulmonary disease with (acute) exacerbation: Secondary | ICD-10-CM

## 2016-10-28 MED ORDER — PREDNISONE 20 MG PO TABS: ORAL_TABLET | ORAL | 0 refills | Status: DC

## 2016-10-28 NOTE — Progress Notes (Signed)
Subjective:    Patient ID: Blake Hardin, male    DOB: 07-22-1971, 45 y.o.   MRN: [id number]  HPI Patient reports that he has bronchitis. He's been told that he has had bronchitis several times in the past. He smokes 2 packs of cigarettes per day. On examination today he has diffuse expiratory wheezing, decreased air flow. There are no crackles Rales. Patient states that his current cough and wheezing has been going on for more than 2 weeks. He states that he always has a chronic smoker's cough in the morning. However the wheezing is new. I'll perform pulmonary function testing today in office, which reveal moderate obstruction. FEV1 was 3.24 L or 63% of predicted. FVC was 3.7 L or 81% of predicted. FEV1 to FVC ratio 77%. 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 Past Medical History:  Diagnosis Date  . Allergy   . Anxiety   . Bipolar 1 disorder (HCC)   . Chicken pox   . Heart murmur   . Schizoaffective disorder   . Substance abuse    from 1990-2012   Past Surgical History:  Procedure Laterality Date  . LUNG SURGERY    . MYRINGOTOMY    . TONSILLECTOMY     Current Outpatient Prescriptions on File Prior to Visit  Medication Sig Dispense Refill  . diazepam (VALIUM) 5 MG tablet Take 1 tablet (5 mg total) by mouth every 12 (twelve) hours as needed. 30 tablet 0  . divalproex (DEPAKOTE ER) 500 MG 24 hr tablet Take 2 tablets (1,000 mg total) by mouth at bedtime. (Patient taking differently: Take 1,500 mg by mouth at bedtime. ) 60 tablet 0  . gabapentin (NEURONTIN) 800 MG tablet Take 800 mg by mouth 4 (four) times daily.    . haloperidol decanoate (HALDOL DECANOATE) 100 MG/ML injection Inject 0.75 mLs (75 mg total) into the muscle every 30 (thirty) days. (Patient taking differently: Inject 150 mg into the muscle every 30 (thirty) days. ) 1 mL 0  . traZODone (DESYREL) 50 MG tablet Take 1 tablet (50 mg total) by mouth at bedtime as needed for sleep. (Patient taking  differently: Take 100 mg by mouth at bedtime as needed for sleep. ) 30 tablet 0   No current facility-administered medications on file prior to visit.    Allergies  Allergen Reactions  . Coconut Fatty Acids   . Onion   . Penicillins Other (See Comments)    Reaction: unknown Childhood allergy   Social History   Social History  . Marital status: Single    Spouse name: N/A  . Number of children: N/A  . Years of education: N/A   Occupational History  . Not on file.   Social History Main Topics  . Smoking status: Current Every Day Smoker    Packs/day: 1.50    Types: Cigarettes  . Smokeless tobacco: Never Used  . Alcohol use Yes     Comment: 3-4x a week beer  . Drug use:     Types: Methamphetamines, Marijuana  . Sexual activity: Yes    Birth control/ protection: None   Other Topics Concern  . Not on file   Social History Narrative  . No narrative on file      Review of Systems  All other systems reviewed and are negative.      Objective:   Physical Exam  Constitutional: He appears well-developed and well-nourished. No distress.  Neck: No JVD present.  Cardiovascular: Normal rate,  regular rhythm and normal heart sounds.  Exam reveals no gallop and no friction rub.   No murmur heard. Pulmonary/Chest: Effort normal. No respiratory distress. He has decreased breath sounds. He has wheezes.  Musculoskeletal: He exhibits no edema.  Skin: He is not diaphoretic.  Vitals reviewed.         Assessment & Plan:  Obstructive chronic bronchitis with exacerbation (HCC) - Plan: predniSONE (DELTASONE) 20 MG tablet  Given the patient's frequent history of bronchitis, his 45-pack-year history of smoking, I believe the patient likely is developing chronic bronchitis/emphysema. Based on his pulmonary function testing, he would qualify for grade 1 to grade 2. I believe he is having a current exacerbation. Therefore I'll treat him with prednisone taper pack. Also start the patient  on Symbicort 2 inhalations twice daily of 160/4.5. Recheck in 2 weeks. At that time repeat pulmonary function testing to see if any of this is reversible and to see if he is truly as bad when he is not in exacerbation. Would also discuss Chantix at that time however I need to be cautious in this patient with schizoaffective disorder. I did encourage smoking cessation.

## 2016-11-11 ENCOUNTER — Ambulatory Visit: Payer: Medicare Other | Admitting: Family Medicine

## 2016-11-17 ENCOUNTER — Other Ambulatory Visit: Payer: Self-pay | Admitting: Family Medicine

## 2016-11-18 NOTE — Telephone Encounter (Signed)
Ok to refill 

## 2016-11-18 NOTE — Telephone Encounter (Signed)
ok 

## 2016-11-18 NOTE — Telephone Encounter (Signed)
Medication called to pharmacy. 

## 2016-12-15 ENCOUNTER — Encounter: Payer: Self-pay | Admitting: Family Medicine

## 2016-12-15 ENCOUNTER — Ambulatory Visit (INDEPENDENT_AMBULATORY_CARE_PROVIDER_SITE_OTHER): Payer: Medicare Other | Admitting: Family Medicine

## 2016-12-15 VITALS — BP 181/70 | HR 80 | Temp 97.9°F | Resp 16 | Ht 71.0 in | Wt 172.0 lb

## 2016-12-15 DIAGNOSIS — J441 Chronic obstructive pulmonary disease with (acute) exacerbation: Secondary | ICD-10-CM

## 2016-12-15 DIAGNOSIS — F250 Schizoaffective disorder, bipolar type: Secondary | ICD-10-CM

## 2016-12-15 DIAGNOSIS — Z23 Encounter for immunization: Secondary | ICD-10-CM

## 2016-12-15 MED ORDER — BUDESONIDE-FORMOTEROL FUMARATE 160-4.5 MCG/ACT IN AERO: 2.0000 | INHALATION_SPRAY | Freq: Two times a day (BID) | RESPIRATORY_TRACT | 5 refills | Status: DC

## 2016-12-15 MED ORDER — DIAZEPAM 5 MG PO TABS: 5.0000 mg | ORAL_TABLET | Freq: Two times a day (BID) | ORAL | 0 refills | Status: DC | PRN

## 2016-12-15 NOTE — Progress Notes (Signed)
Subjective:    Patient ID: Blake Hardin, male    DOB: 12-14-1970, 46 y.o.   MRN: [id number]  HPI  10/2016 Patient reports that he has bronchitis. He's been told that he has had bronchitis several times in the past. He smokes 2 packs of cigarettes per day. On examination today he has diffuse expiratory wheezing, decreased air flow. There are no crackles Rales. Patient states that his current cough and wheezing has been going on for more than 2 weeks. He states that he always has a chronic smoker's cough in the morning. However the wheezing is new. I'll perform pulmonary function testing today in office, which reveal moderate obstruction. FEV1 was 3.24 L or 63% of predicted. FVC was 3.7 L or 81% of predicted. FEV1 to FVC ratio 77%. 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  At that time, my plan was: Given the patient's frequent history of bronchitis, his 45-pack-year history of smoking, I believe the patient likely is developing chronic bronchitis/emphysema. Based on his pulmonary function testing, he would qualify for grade 1 to grade 2. I believe he is having a current exacerbation. Therefore I'll treat him with prednisone taper pack. Also start the patient on Symbicort 2 inhalations twice daily of 160/4.5. Recheck in 2 weeks. At that time repeat pulmonary function testing to see if any of this is reversible and to see if he is truly as bad when he is not in exacerbation. Would also discuss Chantix at that time however I need to be cautious in this patient with schizoaffective disorder. I did encourage smoking cessation.  12/15/16 Patient continues to smoke. He does think the Symbicort significantly helped his breathing. He would like to continue the Symbicort. He is overdue for his flu shot. At the present time he stays at home and is not busy and therefore he does not believe that he would be able to quit smoking as he has nothing to preoccupy his mind. He is extremely anxious  today. He is currently taking Valium 5 mg by mouth daily as needed for anxiety. He is asking if I could increase the dose so that he can take it more often if needed to help control his anxiety. I believe this patient. I explained to him that I will be willing to give him up to 60 tablets per month 3 used up to twice a day if needed however I strongly encouraged him to ration the medication and use it only as needed. Hopefully he can make do with 45 tablets per month. We will see how he responds to this. Past Medical History:  Diagnosis Date  . Allergy   . Anxiety   . Bipolar 1 disorder (HCC)   . Chicken pox   . Heart murmur   . Schizoaffective disorder   . Substance abuse    from 1990-2012   Past Surgical History:  Procedure Laterality Date  . LUNG SURGERY    . MYRINGOTOMY    . TONSILLECTOMY     Current Outpatient Prescriptions on File Prior to Visit  Medication Sig Dispense Refill  . divalproex (DEPAKOTE ER) 500 MG 24 hr tablet Take 2 tablets (1,000 mg total) by mouth at bedtime. (Patient taking differently: Take 1,500 mg by mouth at bedtime. ) 60 tablet 0  . DULoxetine (CYMBALTA) 30 MG capsule     . haloperidol decanoate (HALDOL DECANOATE) 100 MG/ML injection Inject 0.75 mLs (75 mg total) into the muscle every 30 (thirty) days. (  Patient taking differently: Inject 150 mg into the muscle every 30 (thirty) days. ) 1 mL 0  . traZODone (DESYREL) 50 MG tablet Take 1 tablet (50 mg total) by mouth at bedtime as needed for sleep. (Patient taking differently: Take 100 mg by mouth at bedtime as needed for sleep. ) 30 tablet 0  . gabapentin (NEURONTIN) 800 MG tablet Take 800 mg by mouth 4 (four) times daily.     No current facility-administered medications on file prior to visit.    Allergies  Allergen Reactions  . Coconut Fatty Acids   . Onion   . Penicillins Other (See Comments)    Reaction: unknown Childhood allergy   Social History   Social History  . Marital status: Single     Spouse name: N/A  . Number of children: N/A  . Years of education: N/A   Occupational History  . Not on file.   Social History Main Topics  . Smoking status: Current Every Day Smoker    Packs/day: 1.50    Types: Cigarettes  . Smokeless tobacco: Never Used  . Alcohol use Yes     Comment: 3-4x a week beer  . Drug use:     Types: Methamphetamines, Marijuana  . Sexual activity: Yes    Birth control/ protection: None   Other Topics Concern  . Not on file   Social History Narrative  . No narrative on file      Review of Systems  All other systems reviewed and are negative.      Objective:   Physical Exam  Constitutional: He appears well-developed and well-nourished. No distress.  Neck: No JVD present.  Cardiovascular: Normal rate, regular rhythm and normal heart sounds.  Exam reveals no gallop and no friction rub.   No murmur heard. Pulmonary/Chest: Effort normal. No respiratory distress. He has decreased breath sounds. He has wheezes.  Musculoskeletal: He exhibits no edema.  Skin: He is not diaphoretic.  Vitals reviewed.         Assessment & Plan:  Needs flu shot - Plan: Flu Vaccine QUAD 36+ mos IM  Obstructive chronic bronchitis with exacerbation (HCC)  Schizoaffective disorder, bipolar type (HCC)  Symbicort 160/4.5, 2 puffs inhaled twice daily. Recommended smoking cessation. Increase valium to 5 mg by mouth twice a day when necessary. I believe the patient is self-medicating with marijuana to help control his anxiety. I do not believe that he intends to abuse the medication I believe his anxiety is poorly controlled. I've asked him to try to ration the Valium is much as possible.  His blood pressure is very high today but he is also extremely anxious. BP Readings from Last 3 Encounters:  12/15/16 (!) 181/70  10/28/16 130/90  03/03/16 108/68   His blood pressure has never been this high before. Therefore I recommended checking his blood pressure at home  frequently and monitoring her closely area believe this is an aberration due to the fact the patient is extremely anxious today. Patient did receive his flu shot

## 2016-12-30 ENCOUNTER — Encounter (HOSPITAL_COMMUNITY): Payer: Self-pay

## 2016-12-30 ENCOUNTER — Observation Stay (HOSPITAL_COMMUNITY)
Admission: EM | Admit: 2016-12-30 | Discharge: 2016-12-31 | Disposition: A | Discharge status: Home / Self Care | Payer: Medicare Other | Attending: Internal Medicine | Admitting: Internal Medicine

## 2016-12-30 ENCOUNTER — Emergency Department (HOSPITAL_COMMUNITY): Payer: Medicare Other

## 2016-12-30 DIAGNOSIS — F17210 Nicotine dependence, cigarettes, uncomplicated: Secondary | ICD-10-CM | POA: Insufficient documentation

## 2016-12-30 DIAGNOSIS — K0889 Other specified disorders of teeth and supporting structures: Secondary | ICD-10-CM | POA: Diagnosis not present

## 2016-12-30 DIAGNOSIS — K047 Periapical abscess without sinus: Secondary | ICD-10-CM

## 2016-12-30 DIAGNOSIS — R220 Localized swelling, mass and lump, head: Secondary | ICD-10-CM | POA: Diagnosis not present

## 2016-12-30 DIAGNOSIS — L03211 Cellulitis of face: Secondary | ICD-10-CM | POA: Diagnosis not present

## 2016-12-30 LAB — CBC WITH DIFFERENTIAL/PLATELET
Basophils Absolute: 0.1 10*3/uL (ref 0.0–0.1)
Basophils Relative: 0 %
EOS ABS: 0.2 10*3/uL (ref 0.0–0.7)
Eosinophils Relative: 1 %
HEMATOCRIT: 38.5 % — AB (ref 39.0–52.0)
HEMOGLOBIN: 13.4 g/dL (ref 13.0–17.0)
LYMPHS ABS: 1.6 10*3/uL (ref 0.7–4.0)
LYMPHS PCT: 12 %
MCH: 32.2 pg (ref 26.0–34.0)
MCHC: 34.8 g/dL (ref 30.0–36.0)
MCV: 92.5 fL (ref 78.0–100.0)
MONOS PCT: 9 %
Monocytes Absolute: 1.1 10*3/uL — ABNORMAL HIGH (ref 0.1–1.0)
NEUTROS PCT: 78 %
Neutro Abs: 9.7 10*3/uL — ABNORMAL HIGH (ref 1.7–7.7)
Platelets: 205 10*3/uL (ref 150–400)
RBC: 4.16 MIL/uL — AB (ref 4.22–5.81)
RDW: 12.9 % (ref 11.5–15.5)
WBC: 12.6 10*3/uL — AB (ref 4.0–10.5)

## 2016-12-30 LAB — I-STAT CHEM 8, ED
BUN: 9 mg/dL (ref 6–20)
CALCIUM ION: 1.13 mmol/L — AB (ref 1.15–1.40)
CREATININE: 1.1 mg/dL (ref 0.61–1.24)
Chloride: 105 mmol/L (ref 101–111)
GLUCOSE: 110 mg/dL — AB (ref 65–99)
HCT: 37 % — ABNORMAL LOW (ref 39.0–52.0)
Hemoglobin: 12.6 g/dL — ABNORMAL LOW (ref 13.0–17.0)
Potassium: 4.1 mmol/L (ref 3.5–5.1)
Sodium: 140 mmol/L (ref 135–145)
TCO2: 23 mmol/L (ref 0–100)

## 2016-12-30 MED ORDER — ACETAMINOPHEN 325 MG PO TABS: 650.0000 mg | ORAL_TABLET | Freq: Four times a day (QID) | ORAL | Status: DC | PRN

## 2016-12-30 MED ORDER — HYDROMORPHONE HCL 1 MG/ML IJ SOLN
1.0000 mg | Freq: Once | INTRAMUSCULAR | Status: AC
  Administered 2016-12-30: 1 mg via INTRAVENOUS
  Filled 2016-12-30: qty 1

## 2016-12-30 MED ORDER — TRAZODONE HCL 50 MG PO TABS
200.0000 mg | ORAL_TABLET | Freq: Every day | ORAL | Status: DC
  Administered 2016-12-30: 200 mg via ORAL
  Filled 2016-12-30: qty 4

## 2016-12-30 MED ORDER — IOPAMIDOL (ISOVUE-300) INJECTION 61%
75.0000 mL | Freq: Once | INTRAVENOUS | Status: AC | PRN
  Administered 2016-12-30: 75 mL via INTRAVENOUS

## 2016-12-30 MED ORDER — KETOROLAC TROMETHAMINE 30 MG/ML IJ SOLN
30.0000 mg | Freq: Once | INTRAMUSCULAR | Status: AC
  Administered 2016-12-30: 30 mg via INTRAVENOUS
  Filled 2016-12-30: qty 1

## 2016-12-30 MED ORDER — SODIUM CHLORIDE 0.9% FLUSH: 3.0000 mL | INTRAVENOUS | Status: DC | PRN

## 2016-12-30 MED ORDER — CLINDAMYCIN PHOSPHATE 600 MG/50ML IV SOLN
600.0000 mg | Freq: Once | INTRAVENOUS | Status: AC
  Administered 2016-12-30: 600 mg via INTRAVENOUS
  Filled 2016-12-30: qty 50

## 2016-12-30 MED ORDER — NICOTINE 21 MG/24HR TD PT24
21.0000 mg | MEDICATED_PATCH | Freq: Every day | TRANSDERMAL | Status: DC
  Administered 2016-12-30 – 2016-12-31 (×2): 21 mg via TRANSDERMAL
  Filled 2016-12-30 (×2): qty 1

## 2016-12-30 MED ORDER — ONDANSETRON HCL 4 MG/2ML IJ SOLN: 4.0000 mg | Freq: Four times a day (QID) | INTRAMUSCULAR | Status: DC | PRN

## 2016-12-30 MED ORDER — IBUPROFEN 600 MG PO TABS
600.0000 mg | ORAL_TABLET | Freq: Four times a day (QID) | ORAL | Status: DC | PRN
  Administered 2016-12-30 – 2016-12-31 (×2): 600 mg via ORAL
  Filled 2016-12-30 (×2): qty 1

## 2016-12-30 MED ORDER — CLINDAMYCIN PHOSPHATE 600 MG/50ML IV SOLN
600.0000 mg | Freq: Four times a day (QID) | INTRAVENOUS | Status: DC
  Administered 2016-12-30 – 2016-12-31 (×4): 600 mg via INTRAVENOUS
  Filled 2016-12-30 (×8): qty 50

## 2016-12-30 MED ORDER — SODIUM CHLORIDE 0.9 % IV SOLN: 250.0000 mL | INTRAVENOUS | Status: DC | PRN

## 2016-12-30 MED ORDER — SODIUM CHLORIDE 0.9 % IV BOLUS (SEPSIS)
1000.0000 mL | Freq: Once | INTRAVENOUS | Status: AC
  Administered 2016-12-30: 1000 mL via INTRAVENOUS

## 2016-12-30 MED ORDER — DIVALPROEX SODIUM ER 500 MG PO TB24
1500.0000 mg | ORAL_TABLET | Freq: Every day | ORAL | Status: DC
  Administered 2016-12-30: 1500 mg via ORAL
  Filled 2016-12-30: qty 3

## 2016-12-30 MED ORDER — ACETAMINOPHEN 650 MG RE SUPP: 650.0000 mg | Freq: Four times a day (QID) | RECTAL | Status: DC | PRN

## 2016-12-30 MED ORDER — DIAZEPAM 5 MG PO TABS: 5.0000 mg | ORAL_TABLET | Freq: Two times a day (BID) | ORAL | Status: DC | PRN

## 2016-12-30 MED ORDER — SODIUM CHLORIDE 0.9% FLUSH
3.0000 mL | Freq: Two times a day (BID) | INTRAVENOUS | Status: DC
  Administered 2016-12-30 – 2016-12-31 (×2): 3 mL via INTRAVENOUS

## 2016-12-30 MED ORDER — TRAMADOL HCL 50 MG PO TABS
50.0000 mg | ORAL_TABLET | Freq: Four times a day (QID) | ORAL | Status: DC | PRN
  Administered 2016-12-30 – 2016-12-31 (×2): 50 mg via ORAL
  Filled 2016-12-30 (×2): qty 1

## 2016-12-30 MED ORDER — MOMETASONE FURO-FORMOTEROL FUM 200-5 MCG/ACT IN AERO
2.0000 | INHALATION_SPRAY | Freq: Two times a day (BID) | RESPIRATORY_TRACT | Status: DC
  Administered 2016-12-30 – 2016-12-31 (×2): 2 via RESPIRATORY_TRACT
  Filled 2016-12-30: qty 8.8

## 2016-12-30 MED ORDER — ONDANSETRON HCL 4 MG PO TABS: 4.0000 mg | ORAL_TABLET | Freq: Four times a day (QID) | ORAL | Status: DC | PRN

## 2016-12-30 MED ORDER — SODIUM CHLORIDE 0.9% FLUSH
3.0000 mL | Freq: Two times a day (BID) | INTRAVENOUS | Status: DC
  Administered 2016-12-30: 3 mL via INTRAVENOUS

## 2016-12-30 NOTE — ED Provider Notes (Signed)
[facility] DEPT Provider Note   CSN: [id number] Arrival date & time: 12/30/16  [id number]     History   Chief Complaint Chief Complaint  Patient presents with  . Abscess    HPI Blake Hardin is a 46 y.o. male.  HPI 46 year old male presents with left facial swelling and concern for dental abscess. Patient states the swelling has been ongoing for the last couple days. He has felt like he's had low-grade fevers but has not checked his temperature. He feels like the infection is coming from his left mandibular molar. He states he's chronically had issues with this tooth. It has not gotten significant worse until the swelling started. He is able to swallow but it is painful. He has not had any shortness of breath, nausea, vomiting, or swelling of his tongue or oropharynx. No drainage into his mouth.  Past Medical History:  Diagnosis Date  . Allergy   . Anxiety   . Bipolar 1 disorder ([place])   . Chicken pox   . Heart murmur   . Schizoaffective disorder   . Substance abuse    from 1990-2012    Patient Active Problem List   Diagnosis Date Noted  . Anxiety   . Heart murmur   . Schizoaffective disorder, bipolar type ([place]) 08/14/2015  . Cannabis use disorder, severe, dependence ([place]) 08/14/2015  . Stimulant use disorder 08/14/2015    Past Surgical History:  Procedure Laterality Date  . LUNG SURGERY    . MYRINGOTOMY    . TONSILLECTOMY         Home Medications    Prior to Admission medications   Medication Sig Start Date End Date Taking? Authorizing Provider  budesonide-formoterol (SYMBICORT) 160-4.5 MCG/ACT inhaler Inhale 2 puffs into the lungs 2 (two) times daily. 12/15/16   Susy Frizzle, MD  diazepam (VALIUM) 5 MG tablet Take 1 tablet (5 mg total) by mouth every 12 (twelve) hours as needed. 12/15/16   Susy Frizzle, MD  divalproex (DEPAKOTE ER) 500 MG 24 hr tablet Take 2 tablets (1,000 mg total) by mouth at bedtime. Patient taking differently: Take 1,500 mg by  mouth at bedtime.  08/20/15   Niel Hummer, NP  DULoxetine (CYMBALTA) 30 MG capsule  10/20/16   Historical Provider, MD  gabapentin (NEURONTIN) 800 MG tablet Take 800 mg by mouth 4 (four) times daily.    Historical Provider, MD  haloperidol decanoate (HALDOL DECANOATE) 100 MG/ML injection Inject 0.75 mLs (75 mg total) into the muscle every 30 (thirty) days. Patient taking differently: Inject 150 mg into the muscle every 30 (thirty) days.  08/24/15   Niel Hummer, NP  traZODone (DESYREL) 50 MG tablet Take 1 tablet (50 mg total) by mouth at bedtime as needed for sleep. Patient taking differently: Take 100 mg by mouth at bedtime as needed for sleep.  08/20/15   Niel Hummer, NP    Family History Family History  Problem Relation Age of Onset  . Mental illness Sister   . Depression Sister   . Arthritis Mother   . Asthma Mother   . Cancer Mother   . Cancer Maternal Grandmother   . Mental illness Maternal Grandmother   . Mental retardation Cousin     Social History Social History  Substance Use Topics  . Smoking status: Current Every Day Smoker    Packs/day: 1.50    Types: Cigarettes  . Smokeless tobacco: Never Used  . Alcohol use Yes     Comment: 3-4x a  week beer     Allergies   Coconut fatty acids; Onion; and Penicillins   Review of Systems Review of Systems  Constitutional: Positive for fever (subjective).  HENT: Positive for dental problem, facial swelling and trouble swallowing (painful).   Respiratory: Negative for shortness of breath.   Gastrointestinal: Negative for nausea and vomiting.  All other systems reviewed and are negative.    Physical Exam Updated Vital Signs BP 148/100 (BP Location: Right Arm)   Pulse 90   Temp 98.6 F (37 C) (Oral)   Resp 18   Ht 5\' 11"  (1.803 m)   Wt 172 lb (78 kg)   SpO2 98%   BMI 23.99 kg/m   Physical Exam  Constitutional: He is oriented to person, place, and time. He appears well-developed and well-nourished.  HENT:    Head: Normocephalic and atraumatic.    Right Ear: External ear normal.  Left Ear: External ear normal.  Nose: Nose normal.  Mouth/Throat: Mucous membranes are normal. No trismus in the jaw. Abnormal dentition.    Left gingival swollen and tender. No one place of fluctuance c/w abscess  Eyes: Right eye exhibits no discharge. Left eye exhibits no discharge.  Neck: Neck supple.  Cardiovascular: Normal rate, regular rhythm and normal heart sounds.   Pulmonary/Chest: Effort normal and breath sounds normal.  Abdominal: He exhibits no distension.  Musculoskeletal: He exhibits no edema.  Neurological: He is alert and oriented to person, place, and time.  Skin: Skin is warm and dry.  Nursing note and vitals reviewed.    ED Treatments / Results  Labs (all labs ordered are listed, but only abnormal results are displayed) Labs Reviewed  CBC WITH DIFFERENTIAL/PLATELET - Abnormal; Notable for the following:       Result Value   WBC 12.6 (*)    RBC 4.16 (*)    HCT 38.5 (*)    Neutro Abs 9.7 (*)    Monocytes Absolute 1.1 (*)    All other components within normal limits  I-STAT CHEM 8, ED - Abnormal; Notable for the following:    Glucose, Bld 110 (*)    Calcium, Ion 1.13 (*)    Hemoglobin 12.6 (*)    HCT 37.0 (*)    All other components within normal limits  CBC    EKG  EKG Interpretation None       Radiology Ct Maxillofacial W Contrast  Result Date: 12/30/2016 CLINICAL DATA:  46 year old male with left lower dental pain progressive for 3 days. Left facial swelling an now unable to eat. Initial encounter. EXAM: CT MAXILLOFACIAL WITH CONTRAST TECHNIQUE: Multidetector CT imaging of the maxillofacial structures was performed. Multiplanar CT image reconstructions were also generated. A small metallic BB was placed on the right temple in order to reliably differentiate right from left. COMPARISON:  None. FINDINGS: Negative visualized brain parenchyma. Major vascular structures in the  visible neck and at the skullbase are patent and appear normal. The left vertebral artery is somewhat dominant. Visualized orbits and scalp soft tissues are within normal limits. Negative visible thyroid, larynx, pharynx, parapharyngeal and retropharyngeal spaces. The sublingual space, right submandibular and parotid spaces are normal. There is inflammatory stranding throughout the superficial soft tissues of the left face and extending to the platysma which is thickened. There is stranding and trace fluid deep to the left platysma. The left masticator space, left submandibular space, and a portion of the left parotid space are affected. There is mild asymmetric enhancement of the left submandibular gland. There  is intermittent carious dentition throughout. Large dental caries affecting the residual left mandible molar and the anterior left maxillary molar. There is some periapical lucency at the mandible molar, but no definite cortical breakthrough of the mandible is demonstrated. There is no organized or drainable fluid collection at this time. Reactive appearing left level 1 and level 2 lymph nodes. No cystic or necrotic nodes. Outside of the dentition no other significant osseous abnormality is identified. The paranasal sinuses, tympanic cavities, and mastoids are clear. Rightward nasal septal deviation. IMPRESSION: 1. Trans-spatial inflammation about the left face. Left masticator, parotid, and submandibular spaces are involved. Mild secondary left submandibular gland sialoadenitis. 2. No organized or drainable fluid collection at this time. 3. Poor dentition, with bulky dental caries affecting the posterior left maxillary and residual left mandibular molars. Consider odontogenic source of infection. Electronically Signed   By: Genevie Ann M.D.   On: 12/30/2016 09:09    Procedures Procedures (including critical care time)  Medications Ordered in ED Medications  traZODone (DESYREL) tablet 200 mg (not  administered)  mometasone-formoterol (DULERA) 200-5 MCG/ACT inhaler 2 puff (not administered)  diazepam (VALIUM) tablet 5 mg (not administered)  divalproex (DEPAKOTE ER) 24 hr tablet 1,500 mg (not administered)  sodium chloride flush (NS) 0.9 % injection 3 mL (not administered)  sodium chloride flush (NS) 0.9 % injection 3 mL (not administered)  sodium chloride flush (NS) 0.9 % injection 3 mL (not administered)  0.9 %  sodium chloride infusion (not administered)  acetaminophen (TYLENOL) tablet 650 mg (not administered)    Or  acetaminophen (TYLENOL) suppository 650 mg (not administered)  ibuprofen (ADVIL,MOTRIN) tablet 600 mg (600 mg Oral Given 12/30/16 1558)  ondansetron (ZOFRAN) tablet 4 mg (not administered)    Or  ondansetron (ZOFRAN) injection 4 mg (not administered)  clindamycin (CLEOCIN) IVPB 600 mg (not administered)  sodium chloride 0.9 % bolus 1,000 mL (0 mLs Intravenous Stopped 12/30/16 0900)  HYDROmorphone (DILAUDID) injection 1 mg (1 mg Intravenous Given 12/30/16 0743)  clindamycin (CLEOCIN) IVPB 600 mg (0 mg Intravenous Stopped 12/30/16 0908)  iopamidol (ISOVUE-300) 61 % injection 75 mL (75 mLs Intravenous Contrast Given 12/30/16 0826)  ketorolac (TORADOL) 30 MG/ML injection 30 mg (30 mg Intravenous Given 12/30/16 0909)     Initial Impression / Assessment and Plan / ED Course  I have reviewed the triage vital signs and the nursing notes.  Pertinent labs & imaging results that were available during my care of the patient were reviewed by me and considered in my medical decision making (see chart for details).  Clinical Course as of Dec 30 1702  Tue Dec 30, 2016  0718 Given the degree of swelling and firmness, will get CT to r/o abscess. IV clinda given PCN allergy, pain meds, labs. Airway is stable, talking in full sentences, no stridor.  [SG]  [id number] No dentist or oral surgeon here, will consult [facility]  [SG]  [phone] Dr Dorthula Nettles recommends IV antibiotics but no transfer  [SG]      Clinical Course User Index [SG] Sherwood Gambler, MD    Airway is stable, no trouble swallowing. Given the inflammatory changes are going across multiple spaces/muscle planes will admit for IV antibiotics. However there is no definable abscess so no indication to transfer to [facility] per dentistry there. They would not do surgery based on CT report and could do the IV antibiotics here. Dr. Sarajane Jews to admit.  Final Clinical Impressions(s) / ED Diagnoses   Final diagnoses:  Facial cellulitis    New  Prescriptions New Prescriptions   No medications on file     Sherwood Gambler, MD 12/30/16 1705

## 2016-12-30 NOTE — ED Triage Notes (Signed)
Patient states over the last three days he's developed a left lower dental abscess, which has progressively worsened in pain and swelling to the point where patient can not sleep, eat. Effects swallowing, does not effect breathing

## 2016-12-30 NOTE — H&P (Signed)
History and Physical  Blake Hardin [id number] DOB: January 07, 1971 DOA: 12/30/2016  PCP: Leo Grosser, MD  Patient coming from: home  Chief Complaint: tooth pain  HPI:  45yom presents with left facial swelling and pain. Exam revealed multiple caries and presumed dental infection. CT maxillofacial reviewed with radiologist--cellulitis without abscess or complicating features--IV abx. EDP d/w [facility] who recommended IV abx alone and no surgical intervention in short term. Plan obs for IV abx.  Reports increased pain and swelling left jaw and teeth for several days, severe, no aggravating or alleviating factors except improvement in pain in ED with analgesics. No associated fever.   ED Course: afebrile, VSS, treated with hydromorphone and ketorolac, clindamycin.  Pertinent labs: BMP unremarkable, WBC 12.6 Imaging: CT maxillofacial noted, I d/w Dr. Margo Aye, no abscess or complicating features to suggest need for surgical infection.  Review of Systems:  Negative for fever, visual changes, sore throat, rash, new muscle aches, chest pain, SOB, dysuria, bleeding, n/v/abdominal pain. No difficulty swallowing or breathing.  Past Medical History:  Diagnosis Date  . Allergy   . Anxiety   . Bipolar 1 disorder ([facility])   . Chicken pox   . Heart murmur   . Schizoaffective disorder   . Substance abuse    from 1990-2012    Past Surgical History:  Procedure Laterality Date  . LUNG SURGERY    . MYRINGOTOMY    . TONSILLECTOMY       reports that he has been smoking Cigarettes.  He has been smoking about 1.50 packs per day. He has never used smokeless tobacco. He reports that he drinks alcohol. He reports that he uses drugs, including Methamphetamines and Marijuana. Mobility: ambulatory   Allergies  Allergen Reactions  . Coconut Fatty Acids   . Onion   . Penicillins Other (See Comments)    Reaction: unknown Childhood allergy    Family History  Problem Relation Age of Onset  . Mental  illness Sister   . Depression Sister   . Arthritis Mother   . Asthma Mother   . Cancer Mother   . Cancer Maternal Grandmother   . Mental illness Maternal Grandmother   . Mental retardation Cousin      Prior to Admission medications   Medication Sig Start Date End Date Taking? Authorizing Provider  budesonide-formoterol (SYMBICORT) 160-4.5 MCG/ACT inhaler Inhale 2 puffs into the lungs 2 (two) times daily. 12/15/16  Yes Donita Brooks, MD  diazepam (VALIUM) 5 MG tablet Take 1 tablet (5 mg total) by mouth every 12 (twelve) hours as needed. Patient taking differently: Take 5 mg by mouth every 12 (twelve) hours as needed for anxiety.  12/15/16  Yes Donita Brooks, MD  divalproex (DEPAKOTE ER) 500 MG 24 hr tablet Take 2 tablets (1,000 mg total) by mouth at bedtime. Patient taking differently: Take 1,500 mg by mouth at bedtime.  08/20/15   Thermon Leyland, NP  DULoxetine (CYMBALTA) 30 MG capsule  10/20/16   Historical Provider, MD  gabapentin (NEURONTIN) 800 MG tablet Take 800 mg by mouth 4 (four) times daily.    Historical Provider, MD  haloperidol decanoate (HALDOL DECANOATE) 100 MG/ML injection Inject 0.75 mLs (75 mg total) into the muscle every 30 (thirty) days. Patient taking differently: Inject 150 mg into the muscle every 30 (thirty) days.  08/24/15   Thermon Leyland, NP  traZODone (DESYREL) 50 MG tablet Take 1 tablet (50 mg total) by mouth at bedtime as needed for sleep. Patient taking differently: Take 100  mg by mouth at bedtime as needed for sleep.  08/20/15   Thermon Leyland, NP    Physical Exam: Vitals:   12/30/16 0643 12/30/16 0907 12/30/16 1100 12/30/16 1130  BP:  (!) 146/104 143/100 (!) 143/103  Pulse:  83 88 79  Resp:  18    Temp:  98.5 F (36.9 C)    TempSrc:  Oral    SpO2:  99% 98% 96%  Weight: 78 kg (172 lb)     Height: 5\' 11"  (1.803 m)       Constitutional:  . Appears calm and comfortable Eyes:  . Pupils and irises appear normal . Normal lids ENMT:  . external ears,  nose appear normal . grossly normal hearing . Lips appear normal; teeth in poor condition with multiple caries noted left upper and lower teeth . Oropharynx: mucosa, tongue, posterior pharynx appear normal; able to open jaw fairly wide Left jaw with significant edema, tender to palpation Neck:  . Left-sided edema, mild erythema noted, no fluctuance, nontender to palpation. Full ROM. Marland Kitchen no thyromegaly Respiratory:  . CTA bilaterally, no w/r/r.  . Respiratory effort normal. No retractions or accessory muscle use Cardiovascular:  . RRR, no m/r/g . No LE extremity edema   Abdomen:  . Abdomen appears normal; no tenderness or masses Musculoskeletal:  . RUE, LUE, RLE, LLE   o strength and tone normal, no atrophy, no abnormal movements o No tenderness, masses Skin:  . As above . palpation of skin: no induration or nodules Psychiatric:  . judgement and insight appear normal . Mental status o Mood, affect appropriate  Wt Readings from Last 3 Encounters:  12/30/16 78 kg (172 lb)  12/15/16 78 kg (172 lb)  10/28/16 78.9 kg (174 lb)    I have personally reviewed following labs and imaging studies  Labs on Admission:  CBC:  Recent Labs Lab 12/30/16 0722 12/30/16 0735  WBC 12.6*  --   NEUTROABS 9.7*  --   HGB 13.4 12.6*  HCT 38.5* 37.0*  MCV 92.5  --   PLT 205  --    Basic Metabolic Panel:  Recent Labs Lab 12/30/16 0735  NA 140  K 4.1  CL 105  GLUCOSE 110*  BUN 9  CREATININE 1.10    Radiological Exams on Admission: Ct Maxillofacial W Contrast  Result Date: 12/30/2016 CLINICAL DATA:  46 year old male with left lower dental pain progressive for 3 days. Left facial swelling an now unable to eat. Initial encounter. EXAM: CT MAXILLOFACIAL WITH CONTRAST TECHNIQUE: Multidetector CT imaging of the maxillofacial structures was performed. Multiplanar CT image reconstructions were also generated. A small metallic BB was placed on the right temple in order to reliably  differentiate right from left. COMPARISON:  None. FINDINGS: Negative visualized brain parenchyma. Major vascular structures in the visible neck and at the skullbase are patent and appear normal. The left vertebral artery is somewhat dominant. Visualized orbits and scalp soft tissues are within normal limits. Negative visible thyroid, larynx, pharynx, parapharyngeal and retropharyngeal spaces. The sublingual space, right submandibular and parotid spaces are normal. There is inflammatory stranding throughout the superficial soft tissues of the left face and extending to the platysma which is thickened. There is stranding and trace fluid deep to the left platysma. The left masticator space, left submandibular space, and a portion of the left parotid space are affected. There is mild asymmetric enhancement of the left submandibular gland. There is intermittent carious dentition throughout. Large dental caries affecting the residual left mandible  molar and the anterior left maxillary molar. There is some periapical lucency at the mandible molar, but no definite cortical breakthrough of the mandible is demonstrated. There is no organized or drainable fluid collection at this time. Reactive appearing left level 1 and level 2 lymph nodes. No cystic or necrotic nodes. Outside of the dentition no other significant osseous abnormality is identified. The paranasal sinuses, tympanic cavities, and mastoids are clear. Rightward nasal septal deviation. IMPRESSION: 1. Trans-spatial inflammation about the left face. Left masticator, parotid, and submandibular spaces are involved. Mild secondary left submandibular gland sialoadenitis. 2. No organized or drainable fluid collection at this time. 3. Poor dentition, with bulky dental caries affecting the posterior left maxillary and residual left mandibular molars. Consider odontogenic source of infection. Electronically Signed   By: Odessa Fleming M.D.   On: 12/30/2016 09:09    Principal  Problem:   Dental infection Active Problems:   Facial cellulitis   Assessment/Plan 1. Left facial/jawline cellulitis secondary to dental infection/multiple dental caries, without abscess or complicating features.  EDP d/w [facility] >> declined transfer, rec'd IV abx in short term with definitive extraction at later date  No oral surgery/dental coverage at [facility] until 1/29  Patient alert, no airway involvement, no fever, nontoxic.   Therefore plan short course IV abx with anticipated d/c on oral abx and f/u with oral surgeon as an outpatient.  If fails to improve >> repeat imaging and consider subspecialty involvement.  I discussed above with patient and recommended he line up an appointment ASAP, prior to discharge.  It is my clinical opinion that referral for OBSERVATION is reasonable and necessary in this patient  The aforementioned taken together are felt to place the patient at high risk for further clinical deterioration. However it is anticipated that the patient may be medically stable for discharge from the hospital within 24 to 48 hours.   DVT prophylaxis:SCDs Code Status: full Family Communication: none   Time spent: 60 minutes  Brendia Sacks, MD  [facility] Direct contact: [phone] --Via amion app OR  --[email or web address]; password [email or web address]  7PM-7AM contact night coverage as above  12/30/2016, 11:42 AM

## 2016-12-30 NOTE — ED Notes (Signed)
ED Provider at bedside. 

## 2016-12-30 NOTE — ED Notes (Signed)
Room assigned, called to give report, Rn will call back

## 2016-12-31 DIAGNOSIS — K047 Periapical abscess without sinus: Secondary | ICD-10-CM | POA: Diagnosis not present

## 2016-12-31 LAB — CBC
HCT: 36.4 % — ABNORMAL LOW (ref 39.0–52.0)
Hemoglobin: 12.7 g/dL — ABNORMAL LOW (ref 13.0–17.0)
MCH: 32.3 pg (ref 26.0–34.0)
MCHC: 34.9 g/dL (ref 30.0–36.0)
MCV: 92.6 fL (ref 78.0–100.0)
PLATELETS: 215 10*3/uL (ref 150–400)
RBC: 3.93 MIL/uL — ABNORMAL LOW (ref 4.22–5.81)
RDW: 12.5 % (ref 11.5–15.5)
WBC: 8.8 10*3/uL (ref 4.0–10.5)

## 2016-12-31 MED ORDER — CLINDAMYCIN HCL 300 MG PO CAPS: 300.0000 mg | ORAL_CAPSULE | Freq: Three times a day (TID) | ORAL | 0 refills | Status: DC

## 2016-12-31 NOTE — Discharge Summary (Signed)
Physician Discharge Summary  Blake Hardin XBJ:[id number] DOB: 12-23-1970 DOA: 12/30/2016  PCP: Leo Grosser, MD  Admit date: 12/30/2016 Discharge date: 12/31/2016  Time spent: 45 minutes  Recommendations for Outpatient Follow-up:  -To be discharged home today on a ten-day course of clindamycin for his odontogenic infection.  -Advised to secure follow-up with outpatient dentist as soon as possible.  Discharge Diagnoses:  Principal Problem:   Dental infection Active Problems:   Facial cellulitis   Discharge Condition: Stable and improved  Filed Weights   12/30/16 0643 12/30/16 1514  Weight: 78 kg (172 lb) 78 kg (172 lb)    History of present illness:  As per Dr. Irene Limbo on 1/23: 45yom presents with left facial swelling and pain. Exam revealed multiple caries and presumed dental infection. CT maxillofacial reviewed with radiologist--cellulitis without abscess or complicating features--IV abx. EDP d/w [facility] who recommended IV abx alone and no surgical intervention in short term. Plan obs for IV abx.  Reports increased pain and swelling left jaw and teeth for several days, severe, no aggravating or alleviating factors except improvement in pain in ED with analgesics. No associated fever.   ED Course: afebrile, VSS, treated with hydromorphone and ketorolac, clindamycin.   Hospital Course:   Odontogenic infection -CT scan without indication for abscess, however significant dental caries. -Plan to treat with a 10 day course of clindamycin, advised to secure outpatient follow-up with dentist as soon as possible.  Procedures:  None   Consultations:  None  Discharge Instructions  Discharge Instructions    Increase activity slowly    Complete by:  As directed      Allergies as of 12/31/2016      Reactions   Coconut Fatty Acids    Onion    Penicillins Other (See Comments)   Reaction: unknown Childhood allergy      Medication List    TAKE these  medications   budesonide-formoterol 160-4.5 MCG/ACT inhaler Commonly known as:  SYMBICORT Inhale 2 puffs into the lungs 2 (two) times daily.   clindamycin 300 MG capsule Commonly known as:  CLEOCIN Take 1 capsule (300 mg total) by mouth 3 (three) times daily.   diazepam 5 MG tablet Commonly known as:  VALIUM Take 1 tablet (5 mg total) by mouth every 12 (twelve) hours as needed. What changed:  reasons to take this   divalproex 500 MG 24 hr tablet Commonly known as:  DEPAKOTE ER Take 2 tablets (1,000 mg total) by mouth at bedtime. What changed:  how much to take   haloperidol decanoate 100 MG/ML injection Commonly known as:  HALDOL DECANOATE Inject 0.75 mLs (75 mg total) into the muscle every 30 (thirty) days. What changed:  how much to take   traZODone 100 MG tablet Commonly known as:  DESYREL Take 200 mg by mouth at bedtime.   traZODone 50 MG tablet Commonly known as:  DESYREL Take 1 tablet (50 mg total) by mouth at bedtime as needed for sleep.      Allergies  Allergen Reactions  . Coconut Fatty Acids   . Onion   . Penicillins Other (See Comments)    Reaction: unknown Childhood allergy   Follow-up Information    PICKARD,WARREN TOM, MD. Schedule an appointment as soon as possible for a visit in 2 week(s).   Specialty:  Family Medicine Contact information: [address] [place] [address] [phone]        Dentist. Schedule an appointment as soon as possible for  a visit today.   Why:  As soon as possible           The results of significant diagnostics from this hospitalization (including imaging, microbiology, ancillary and laboratory) are listed below for reference.    Significant Diagnostic Studies: Ct Maxillofacial W Contrast  Result Date: 12/30/2016 CLINICAL DATA:  46 year old male with left lower dental pain progressive for 3 days. Left facial swelling an now unable to eat. Initial encounter. EXAM: CT MAXILLOFACIAL WITH CONTRAST  TECHNIQUE: Multidetector CT imaging of the maxillofacial structures was performed. Multiplanar CT image reconstructions were also generated. A small metallic BB was placed on the right temple in order to reliably differentiate right from left. COMPARISON:  None. FINDINGS: Negative visualized brain parenchyma. Major vascular structures in the visible neck and at the skullbase are patent and appear normal. The left vertebral artery is somewhat dominant. Visualized orbits and scalp soft tissues are within normal limits. Negative visible thyroid, larynx, pharynx, parapharyngeal and retropharyngeal spaces. The sublingual space, right submandibular and parotid spaces are normal. There is inflammatory stranding throughout the superficial soft tissues of the left face and extending to the platysma which is thickened. There is stranding and trace fluid deep to the left platysma. The left masticator space, left submandibular space, and a portion of the left parotid space are affected. There is mild asymmetric enhancement of the left submandibular gland. There is intermittent carious dentition throughout. Large dental caries affecting the residual left mandible molar and the anterior left maxillary molar. There is some periapical lucency at the mandible molar, but no definite cortical breakthrough of the mandible is demonstrated. There is no organized or drainable fluid collection at this time. Reactive appearing left level 1 and level 2 lymph nodes. No cystic or necrotic nodes. Outside of the dentition no other significant osseous abnormality is identified. The paranasal sinuses, tympanic cavities, and mastoids are clear. Rightward nasal septal deviation. IMPRESSION: 1. Trans-spatial inflammation about the left face. Left masticator, parotid, and submandibular spaces are involved. Mild secondary left submandibular gland sialoadenitis. 2. No organized or drainable fluid collection at this time. 3. Poor dentition, with bulky  dental caries affecting the posterior left maxillary and residual left mandibular molars. Consider odontogenic source of infection. Electronically Signed   By: Odessa Fleming M.D.   On: 12/30/2016 09:09    Microbiology: No results found for this or any previous visit (from the past 240 hour(s)).   Labs: Basic Metabolic Panel:  Recent Labs Lab 12/30/16 0735  NA 140  K 4.1  CL 105  GLUCOSE 110*  BUN 9  CREATININE 1.10   Liver Function Tests: No results for input(s): AST, ALT, ALKPHOS, BILITOT, PROT, ALBUMIN in the last 168 hours. No results for input(s): LIPASE, AMYLASE in the last 168 hours. No results for input(s): AMMONIA in the last 168 hours. CBC:  Recent Labs Lab 12/30/16 0722 12/30/16 0735 12/31/16 0547  WBC 12.6*  --  8.8  NEUTROABS 9.7*  --   --   HGB 13.4 12.6* 12.7*  HCT 38.5* 37.0* 36.4*  MCV 92.5  --  92.6  PLT 205  --  215   Cardiac Enzymes: No results for input(s): CKTOTAL, CKMB, CKMBINDEX, TROPONINI in the last 168 hours. BNP: BNP (last 3 results) No results for input(s): BNP in the last 8760 hours.  ProBNP (last 3 results) No results for input(s): PROBNP in the last 8760 hours.  CBG: No results for input(s): GLUCAP in the last 168 hours.  SignedChaya Jan  [facility] Pager: [phone] 12/31/2016, 3:34 PM

## 2016-12-31 NOTE — Progress Notes (Signed)
Patient discharged home with IV removed and site intact. Patient sent home with prescriptions and personal belongings.

## 2017-01-12 ENCOUNTER — Other Ambulatory Visit: Payer: Self-pay | Admitting: Family Medicine

## 2017-01-12 NOTE — Telephone Encounter (Signed)
ok 

## 2017-01-12 NOTE — Telephone Encounter (Signed)
Medication called to pharmacy. 

## 2017-01-12 NOTE — Telephone Encounter (Signed)
Ok to refill 

## 2017-01-14 ENCOUNTER — Telehealth: Payer: Self-pay | Admitting: Family Medicine

## 2017-01-14 DIAGNOSIS — F209 Schizophrenia, unspecified: Secondary | ICD-10-CM | POA: Diagnosis not present

## 2017-01-14 MED ORDER — DIAZEPAM 5 MG PO TABS: ORAL_TABLET | ORAL | 2 refills | Status: DC

## 2017-01-14 NOTE — Telephone Encounter (Signed)
Pt called and states that his valuim was called in for 30 tablets and that [facility] had changed it to 60 a month. Called and spoke to [facility] and he will have to wait until his rx he picked up runs out and then can get refill of the 60 tablets. I called in for the 60 tabs with 2 refills.

## 2017-04-06 ENCOUNTER — Encounter: Payer: Self-pay | Admitting: Family Medicine

## 2017-04-27 ENCOUNTER — Other Ambulatory Visit: Payer: Self-pay | Admitting: Family Medicine

## 2017-04-27 NOTE — Telephone Encounter (Signed)
Ok to refill 

## 2017-04-27 NOTE — Telephone Encounter (Signed)
ok 

## 2017-07-29 ENCOUNTER — Other Ambulatory Visit: Payer: Self-pay | Admitting: Family Medicine

## 2017-07-29 NOTE — Telephone Encounter (Signed)
Ok to refill 

## 2017-07-30 NOTE — Telephone Encounter (Signed)
ok 

## 2017-08-17 ENCOUNTER — Encounter: Payer: Self-pay | Admitting: Family Medicine

## 2017-09-28 IMAGING — CT CT MAXILLOFACIAL W/ CM
3 series · 15 of 47 positions shown, 18 images · IV contrast (agent unspecified)
Comparison: None.

CLINICAL DATA: 45-year-old male with left lower dental pain
progressive for 3 days. Left facial swelling an now unable to eat.
Initial encounter.

EXAM:
CT MAXILLOFACIAL WITH CONTRAST
TECHNIQUE: Multidetector CT imaging of the maxillofacial structures was
performed. Multiplanar CT image reconstructions were also generated.
A small metallic BB was placed on the right temple in order to
reliably differentiate right from left.

[Series 2: max soft · axial · 0.33mm/px · z∈[+64,+230]mm · 9 of 97 slices shown, 12 images]
[im 7/97  brain]
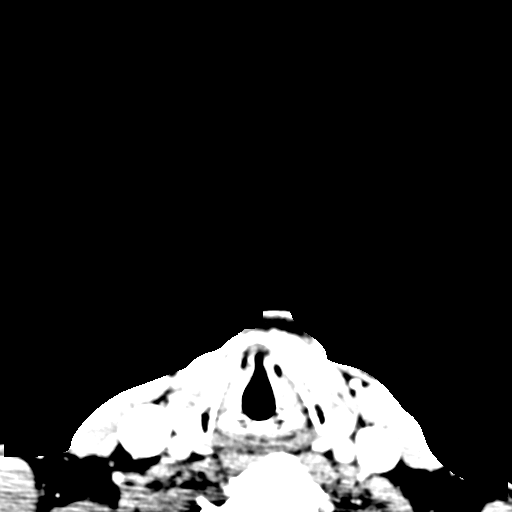
[im 7/97  bone]
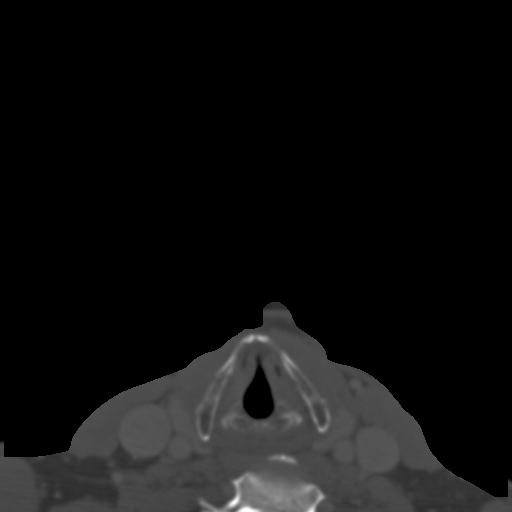
[im 17/97  bone]
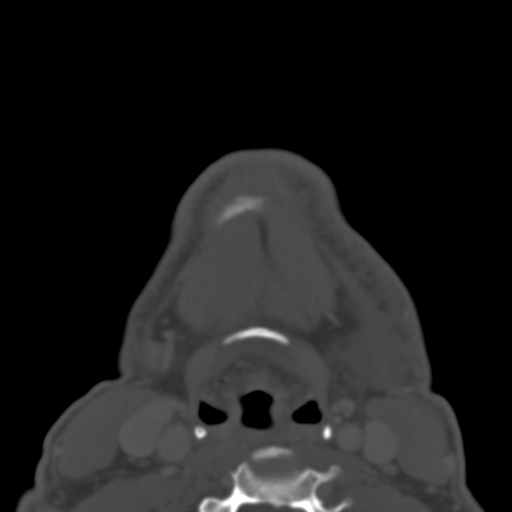
[im 27/97  bone]
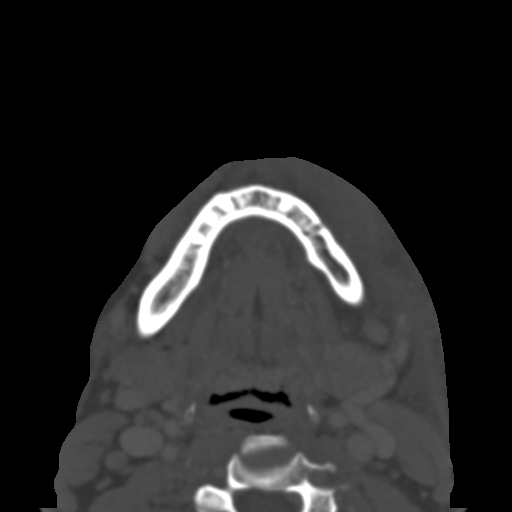
[im 37/97  bone]
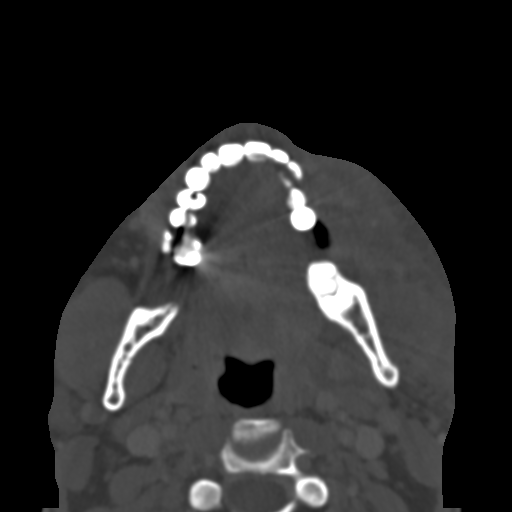
[im 50/97  brain]
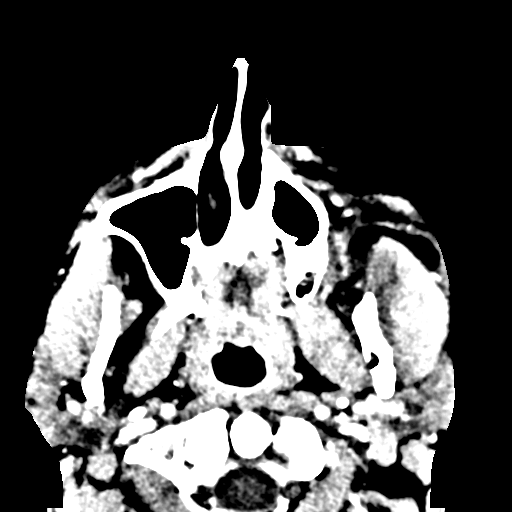
[im 50/97  bone]
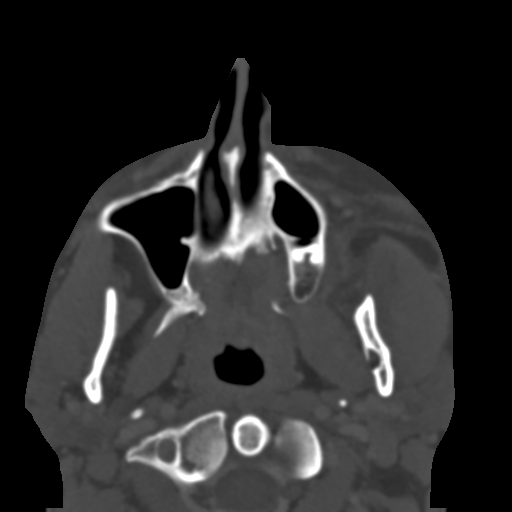
[im 60/97  bone]
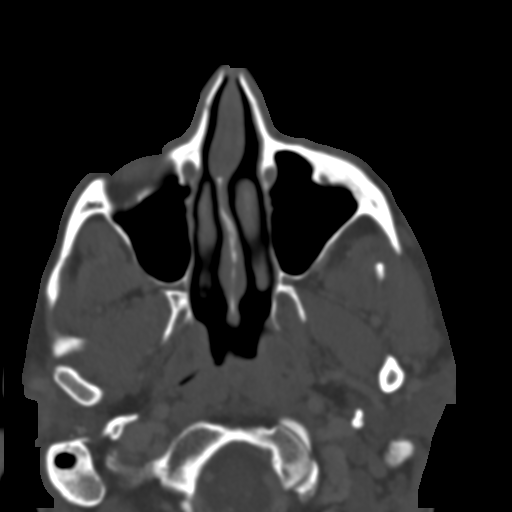
[im 70/97  bone]
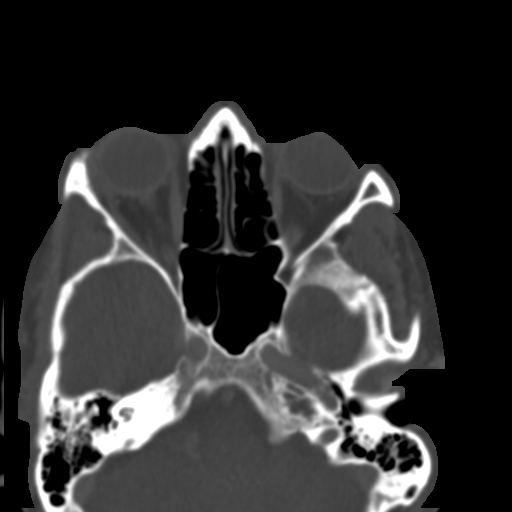
[im 80/97  bone]
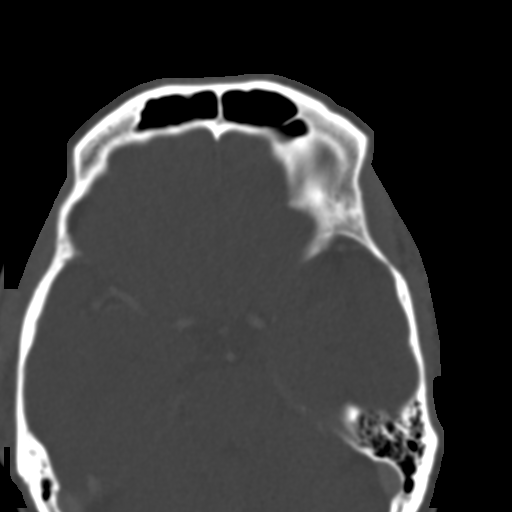
[im 90/97  brain]
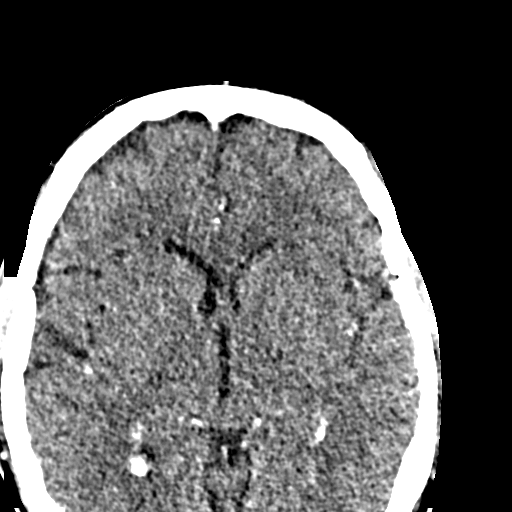
[im 90/97  bone]
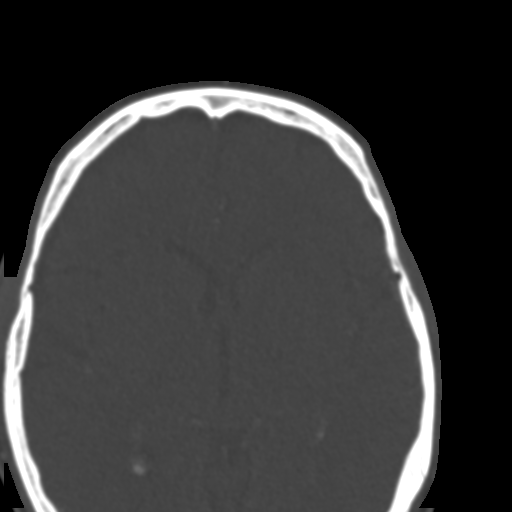

[Series 4: coronal soft · coronal · 0.39mm/px · 3 of 92 slices shown]
[im 31/92  bone]
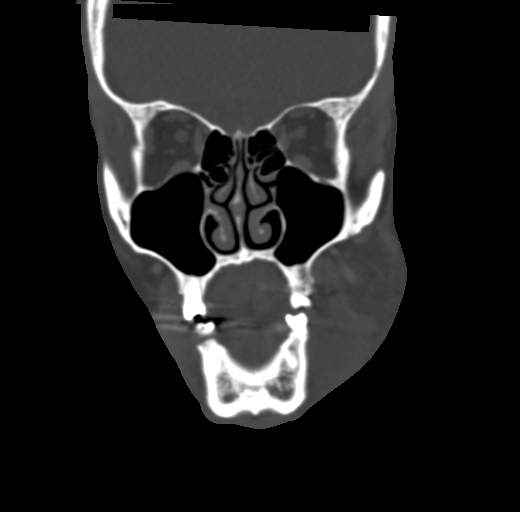
[im 41/92  bone]
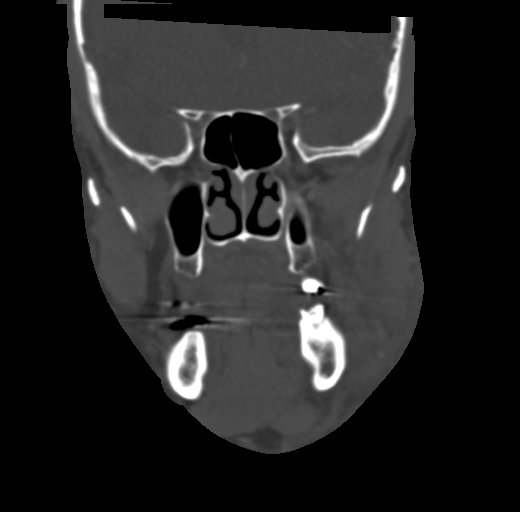
[im 51/92  bone]
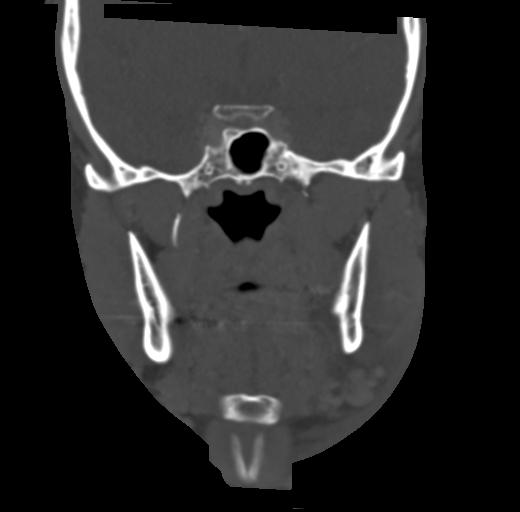

[Series 5: sagittal soft · sagittal · 0.40mm/px · 3 of 81 slices shown]
[im 27/81  bone]
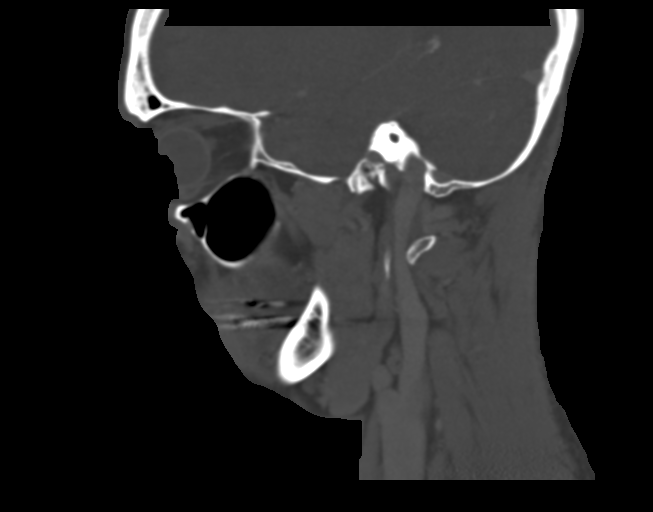
[im 41/81  bone]
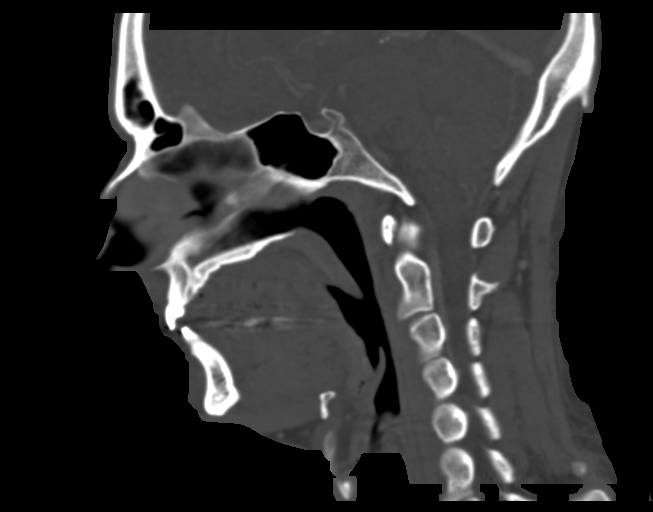
[im 54/81  bone]
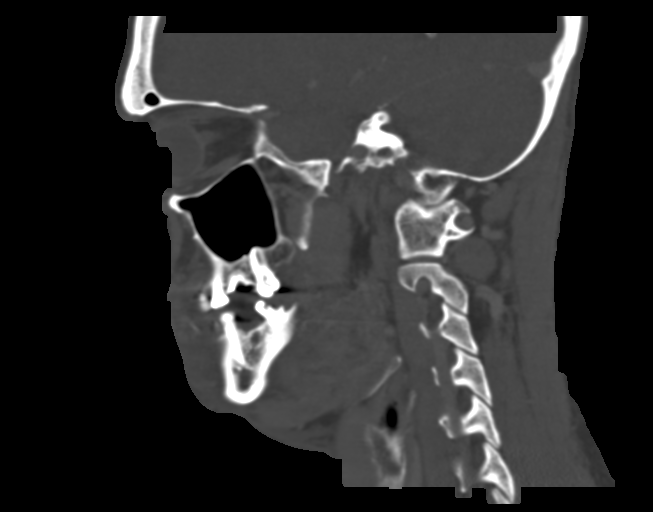

[15 of 47 positions shown; findings below may reference images not displayed]

FINDINGS: Negative visualized brain parenchyma. Major vascular structures in
the visible neck and at the skullbase are patent and appear normal.
The left vertebral artery is somewhat dominant.

Visualized orbits and scalp soft tissues are within normal limits.

Negative visible thyroid, larynx, pharynx, parapharyngeal and
retropharyngeal spaces. The sublingual space, right submandibular
and parotid spaces are normal.

There is inflammatory stranding throughout the superficial soft
tissues of the left face and extending to the platysma which is
thickened. There is stranding and trace fluid deep to the left
platysma. The left masticator space, left submandibular space, and a
portion of the left parotid space are affected. There is mild
asymmetric enhancement of the left submandibular gland.

There is intermittent carious dentition throughout. Large dental
caries affecting the residual left mandible molar and the anterior
left maxillary molar. There is some periapical lucency at the
mandible molar, but no definite cortical breakthrough of the
mandible is demonstrated.

There is no organized or drainable fluid collection at this time.

Reactive appearing left level 1 and level 2 lymph nodes. No cystic
or necrotic nodes.

Outside of the dentition no other significant osseous abnormality is
identified. The paranasal sinuses, tympanic cavities, and mastoids
are clear. Rightward nasal septal deviation.
IMPRESSION: 1. Trans-spatial inflammation about the left face. Left masticator,
parotid, and submandibular spaces are involved. Mild secondary left
submandibular gland sialoadenitis.
2. No organized or drainable fluid collection at this time.
3. Poor dentition, with bulky dental caries affecting the posterior
left maxillary and residual left mandibular molars. Consider
odontogenic source of infection.

## 2017-11-09 ENCOUNTER — Other Ambulatory Visit: Payer: Self-pay | Admitting: Family Medicine

## 2017-11-10 ENCOUNTER — Other Ambulatory Visit: Payer: Self-pay | Admitting: Family Medicine

## 2017-11-11 ENCOUNTER — Telehealth: Payer: Self-pay | Admitting: Family Medicine

## 2017-11-11 NOTE — Telephone Encounter (Signed)
Ok to refill 

## 2017-11-11 NOTE — Telephone Encounter (Signed)
Patient is calling to get refill on his diazapam, he cannot pay on his balance right now is is currently dismissed  [phone]

## 2017-11-12 MED ORDER — DIAZEPAM 5 MG PO TABS: ORAL_TABLET | ORAL | 0 refills | Status: AC

## 2017-11-12 NOTE — Telephone Encounter (Signed)
Call placed to patient and patient made aware. States that he will try to make payment plan with office in January, but he is unable to pay anything at this time.   Medication called to pharmacy with taper. Spoke with Cyril Mourning, Pharmacist.

## 2017-11-12 NOTE — Telephone Encounter (Signed)
Last refill without paying on balance will be to [facility] HIM OFF TO AVOID WITHDRAWAL.  Valium 5 mg tabs, 1/2 tab bid for 2 week then 1/2 tab poqday for 2 weeks then stop (21 tabs).

## 2020-03-13 DIAGNOSIS — R1319 Other dysphagia: Secondary | ICD-10-CM | POA: Diagnosis not present

## 2020-03-13 DIAGNOSIS — R1310 Dysphagia, unspecified: Secondary | ICD-10-CM | POA: Diagnosis not present

## 2020-03-13 DIAGNOSIS — T1490XA Injury, unspecified, initial encounter: Secondary | ICD-10-CM | POA: Diagnosis not present

## 2020-03-13 DIAGNOSIS — S02839A Fracture of medial orbital wall, unspecified side, initial encounter for closed fracture: Secondary | ICD-10-CM | POA: Diagnosis not present

## 2020-03-13 DIAGNOSIS — J9601 Acute respiratory failure with hypoxia: Secondary | ICD-10-CM | POA: Diagnosis not present

## 2020-03-13 DIAGNOSIS — S02609A Fracture of mandible, unspecified, initial encounter for closed fracture: Secondary | ICD-10-CM | POA: Diagnosis not present

## 2020-03-13 DIAGNOSIS — W3400XA Accidental discharge from unspecified firearms or gun, initial encounter: Secondary | ICD-10-CM | POA: Diagnosis not present

## 2020-03-13 DIAGNOSIS — S022XXB Fracture of nasal bones, initial encounter for open fracture: Secondary | ICD-10-CM | POA: Diagnosis not present

## 2020-03-13 DIAGNOSIS — S01511A Laceration without foreign body of lip, initial encounter: Secondary | ICD-10-CM | POA: Diagnosis not present

## 2020-03-13 DIAGNOSIS — Z20822 Contact with and (suspected) exposure to covid-19: Secondary | ICD-10-CM | POA: Diagnosis not present

## 2020-03-13 DIAGNOSIS — S0183XA Puncture wound without foreign body of other part of head, initial encounter: Secondary | ICD-10-CM | POA: Diagnosis not present

## 2020-03-13 DIAGNOSIS — J9602 Acute respiratory failure with hypercapnia: Secondary | ICD-10-CM | POA: Diagnosis not present

## 2020-03-13 DIAGNOSIS — Z743 Need for continuous supervision: Secondary | ICD-10-CM | POA: Diagnosis not present

## 2020-03-13 DIAGNOSIS — Y999 Unspecified external cause status: Secondary | ICD-10-CM | POA: Diagnosis not present

## 2020-03-13 DIAGNOSIS — Z79899 Other long term (current) drug therapy: Secondary | ICD-10-CM | POA: Diagnosis not present

## 2020-03-13 DIAGNOSIS — C760 Malignant neoplasm of head, face and neck: Secondary | ICD-10-CM | POA: Diagnosis not present

## 2020-03-13 DIAGNOSIS — Z4682 Encounter for fitting and adjustment of non-vascular catheter: Secondary | ICD-10-CM | POA: Diagnosis not present

## 2020-03-13 DIAGNOSIS — S0240DB Maxillary fracture, left side, initial encounter for open fracture: Secondary | ICD-10-CM | POA: Diagnosis not present

## 2020-03-13 DIAGNOSIS — S0180XA Unspecified open wound of other part of head, initial encounter: Secondary | ICD-10-CM | POA: Diagnosis not present

## 2020-03-13 DIAGNOSIS — J449 Chronic obstructive pulmonary disease, unspecified: Secondary | ICD-10-CM | POA: Diagnosis not present

## 2020-03-13 DIAGNOSIS — Z4659 Encounter for fitting and adjustment of other gastrointestinal appliance and device: Secondary | ICD-10-CM | POA: Diagnosis not present

## 2020-03-13 DIAGNOSIS — R404 Transient alteration of awareness: Secondary | ICD-10-CM | POA: Diagnosis not present

## 2020-03-13 DIAGNOSIS — Z930 Tracheostomy status: Secondary | ICD-10-CM | POA: Diagnosis not present

## 2020-03-13 DIAGNOSIS — S02602A Fracture of unspecified part of body of left mandible, initial encounter for closed fracture: Secondary | ICD-10-CM | POA: Diagnosis not present

## 2020-03-13 DIAGNOSIS — R000 Tachycardia, unspecified: Secondary | ICD-10-CM | POA: Diagnosis not present

## 2020-03-13 DIAGNOSIS — S0184XA Puncture wound with foreign body of other part of head, initial encounter: Secondary | ICD-10-CM | POA: Diagnosis not present

## 2020-03-13 DIAGNOSIS — S0231XA Fracture of orbital floor, right side, initial encounter for closed fracture: Secondary | ICD-10-CM | POA: Diagnosis not present

## 2020-03-13 DIAGNOSIS — S0181XA Laceration without foreign body of other part of head, initial encounter: Secondary | ICD-10-CM | POA: Diagnosis not present

## 2020-03-13 DIAGNOSIS — R58 Hemorrhage, not elsewhere classified: Secondary | ICD-10-CM | POA: Diagnosis not present

## 2020-03-13 DIAGNOSIS — Z9911 Dependence on respirator [ventilator] status: Secondary | ICD-10-CM | POA: Diagnosis not present

## 2020-03-13 DIAGNOSIS — M542 Cervicalgia: Secondary | ICD-10-CM | POA: Diagnosis not present

## 2020-03-13 DIAGNOSIS — S0292XB Unspecified fracture of facial bones, initial encounter for open fracture: Secondary | ICD-10-CM | POA: Diagnosis not present

## 2020-03-13 DIAGNOSIS — S0232XA Fracture of orbital floor, left side, initial encounter for closed fracture: Secondary | ICD-10-CM | POA: Diagnosis not present

## 2020-03-13 DIAGNOSIS — D62 Acute posthemorrhagic anemia: Secondary | ICD-10-CM | POA: Diagnosis not present

## 2020-03-13 DIAGNOSIS — S0240DA Maxillary fracture, left side, initial encounter for closed fracture: Secondary | ICD-10-CM | POA: Diagnosis not present

## 2020-03-13 DIAGNOSIS — R451 Restlessness and agitation: Secondary | ICD-10-CM | POA: Diagnosis not present

## 2020-03-13 DIAGNOSIS — J9600 Acute respiratory failure, unspecified whether with hypoxia or hypercapnia: Secondary | ICD-10-CM | POA: Diagnosis not present

## 2020-03-13 DIAGNOSIS — R402421 Glasgow coma scale score 9-12, in the field [EMT or ambulance]: Secondary | ICD-10-CM | POA: Diagnosis not present

## 2020-03-13 DIAGNOSIS — S0266XB Fracture of symphysis of mandible, initial encounter for open fracture: Secondary | ICD-10-CM | POA: Diagnosis not present

## 2020-03-13 DIAGNOSIS — S02841A Fracture of lateral orbital wall, right side, initial encounter for closed fracture: Secondary | ICD-10-CM | POA: Diagnosis not present

## 2020-03-13 DIAGNOSIS — R52 Pain, unspecified: Secondary | ICD-10-CM | POA: Diagnosis not present

## 2020-03-13 DIAGNOSIS — S0280XA Fracture of other specified skull and facial bones, unspecified side, initial encounter for closed fracture: Secondary | ICD-10-CM | POA: Diagnosis not present

## 2020-03-13 DIAGNOSIS — S01512A Laceration without foreign body of oral cavity, initial encounter: Secondary | ICD-10-CM | POA: Diagnosis not present

## 2020-03-13 DIAGNOSIS — Z043 Encounter for examination and observation following other accident: Secondary | ICD-10-CM | POA: Diagnosis not present

## 2020-03-13 DIAGNOSIS — H04123 Dry eye syndrome of bilateral lacrimal glands: Secondary | ICD-10-CM | POA: Diagnosis not present

## 2020-04-04 DIAGNOSIS — R1310 Dysphagia, unspecified: Secondary | ICD-10-CM | POA: Diagnosis not present

## 2020-04-04 DIAGNOSIS — C760 Malignant neoplasm of head, face and neck: Secondary | ICD-10-CM | POA: Diagnosis not present

## 2020-04-10 DIAGNOSIS — R1311 Dysphagia, oral phase: Secondary | ICD-10-CM | POA: Diagnosis not present

## 2020-04-10 DIAGNOSIS — S0292XS Unspecified fracture of facial bones, sequela: Secondary | ICD-10-CM | POA: Diagnosis not present

## 2020-04-17 DIAGNOSIS — S0292XS Unspecified fracture of facial bones, sequela: Secondary | ICD-10-CM | POA: Diagnosis not present

## 2020-04-17 DIAGNOSIS — R1311 Dysphagia, oral phase: Secondary | ICD-10-CM | POA: Diagnosis not present

## 2020-04-24 DIAGNOSIS — Z430 Encounter for attention to tracheostomy: Secondary | ICD-10-CM | POA: Diagnosis not present

## 2020-04-27 DIAGNOSIS — C760 Malignant neoplasm of head, face and neck: Secondary | ICD-10-CM | POA: Diagnosis not present

## 2020-04-27 DIAGNOSIS — R1310 Dysphagia, unspecified: Secondary | ICD-10-CM | POA: Diagnosis not present

## 2020-04-27 DIAGNOSIS — Z930 Tracheostomy status: Secondary | ICD-10-CM | POA: Diagnosis not present

## 2020-05-02 DIAGNOSIS — T1490XA Injury, unspecified, initial encounter: Secondary | ICD-10-CM | POA: Diagnosis not present

## 2020-05-02 DIAGNOSIS — Z930 Tracheostomy status: Secondary | ICD-10-CM | POA: Diagnosis not present

## 2020-05-08 DIAGNOSIS — R000 Tachycardia, unspecified: Secondary | ICD-10-CM | POA: Diagnosis not present

## 2020-05-09 DIAGNOSIS — Z01818 Encounter for other preprocedural examination: Secondary | ICD-10-CM | POA: Diagnosis not present

## 2020-05-09 DIAGNOSIS — R1311 Dysphagia, oral phase: Secondary | ICD-10-CM | POA: Diagnosis not present

## 2020-05-09 DIAGNOSIS — R633 Feeding difficulties: Secondary | ICD-10-CM | POA: Diagnosis not present

## 2020-05-09 DIAGNOSIS — Z01812 Encounter for preprocedural laboratory examination: Secondary | ICD-10-CM | POA: Diagnosis not present

## 2020-05-09 DIAGNOSIS — Z20822 Contact with and (suspected) exposure to covid-19: Secondary | ICD-10-CM | POA: Diagnosis not present

## 2020-05-09 DIAGNOSIS — R1312 Dysphagia, oropharyngeal phase: Secondary | ICD-10-CM | POA: Diagnosis not present

## 2020-05-10 DIAGNOSIS — S0292XS Unspecified fracture of facial bones, sequela: Secondary | ICD-10-CM | POA: Diagnosis not present

## 2020-05-10 DIAGNOSIS — S0266XS Fracture of symphysis of mandible, sequela: Secondary | ICD-10-CM | POA: Diagnosis not present

## 2020-05-15 DIAGNOSIS — S0292XS Unspecified fracture of facial bones, sequela: Secondary | ICD-10-CM | POA: Diagnosis not present

## 2020-05-15 DIAGNOSIS — S0180XA Unspecified open wound of other part of head, initial encounter: Secondary | ICD-10-CM | POA: Diagnosis not present

## 2020-05-15 DIAGNOSIS — Z9889 Other specified postprocedural states: Secondary | ICD-10-CM | POA: Diagnosis not present

## 2020-05-15 DIAGNOSIS — S0292XB Unspecified fracture of facial bones, initial encounter for open fracture: Secondary | ICD-10-CM | POA: Diagnosis not present

## 2020-05-15 DIAGNOSIS — S0292XD Unspecified fracture of facial bones, subsequent encounter for fracture with routine healing: Secondary | ICD-10-CM | POA: Diagnosis not present

## 2020-05-15 DIAGNOSIS — M549 Dorsalgia, unspecified: Secondary | ICD-10-CM | POA: Diagnosis not present

## 2020-05-15 DIAGNOSIS — R519 Headache, unspecified: Secondary | ICD-10-CM | POA: Diagnosis not present

## 2020-05-15 DIAGNOSIS — Z428 Encounter for other plastic and reconstructive surgery following medical procedure or healed injury: Secondary | ICD-10-CM | POA: Diagnosis not present

## 2020-05-15 DIAGNOSIS — S098XXS Other specified injuries of head, sequela: Secondary | ICD-10-CM | POA: Diagnosis not present

## 2020-05-15 DIAGNOSIS — Z87891 Personal history of nicotine dependence: Secondary | ICD-10-CM | POA: Diagnosis not present

## 2020-05-15 DIAGNOSIS — G8918 Other acute postprocedural pain: Secondary | ICD-10-CM | POA: Diagnosis not present

## 2020-05-15 DIAGNOSIS — E876 Hypokalemia: Secondary | ICD-10-CM | POA: Diagnosis not present

## 2020-05-15 DIAGNOSIS — K118 Other diseases of salivary glands: Secondary | ICD-10-CM | POA: Diagnosis not present

## 2020-05-15 DIAGNOSIS — Z931 Gastrostomy status: Secondary | ICD-10-CM | POA: Diagnosis not present

## 2020-05-15 DIAGNOSIS — D62 Acute posthemorrhagic anemia: Secondary | ICD-10-CM | POA: Diagnosis not present

## 2020-05-15 DIAGNOSIS — S02609D Fracture of mandible, unspecified, subsequent encounter for fracture with routine healing: Secondary | ICD-10-CM | POA: Diagnosis not present

## 2020-05-15 DIAGNOSIS — M25512 Pain in left shoulder: Secondary | ICD-10-CM | POA: Diagnosis not present

## 2020-05-24 DIAGNOSIS — S0183XD Puncture wound without foreign body of other part of head, subsequent encounter: Secondary | ICD-10-CM | POA: Diagnosis not present

## 2020-05-30 DIAGNOSIS — R633 Feeding difficulties: Secondary | ICD-10-CM | POA: Diagnosis not present

## 2020-05-30 DIAGNOSIS — R1311 Dysphagia, oral phase: Secondary | ICD-10-CM | POA: Diagnosis not present

## 2020-06-02 DIAGNOSIS — T1490XA Injury, unspecified, initial encounter: Secondary | ICD-10-CM | POA: Diagnosis not present

## 2020-06-02 DIAGNOSIS — Z930 Tracheostomy status: Secondary | ICD-10-CM | POA: Diagnosis not present

## 2020-06-07 DIAGNOSIS — Z9889 Other specified postprocedural states: Secondary | ICD-10-CM | POA: Diagnosis not present

## 2020-06-07 DIAGNOSIS — S0993XD Unspecified injury of face, subsequent encounter: Secondary | ICD-10-CM | POA: Diagnosis not present

## 2020-06-07 DIAGNOSIS — S0292XD Unspecified fracture of facial bones, subsequent encounter for fracture with routine healing: Secondary | ICD-10-CM | POA: Diagnosis not present

## 2020-06-19 DIAGNOSIS — C760 Malignant neoplasm of head, face and neck: Secondary | ICD-10-CM | POA: Diagnosis not present

## 2020-06-19 DIAGNOSIS — R1310 Dysphagia, unspecified: Secondary | ICD-10-CM | POA: Diagnosis not present

## 2020-06-20 DIAGNOSIS — Z430 Encounter for attention to tracheostomy: Secondary | ICD-10-CM | POA: Diagnosis not present

## 2020-06-20 DIAGNOSIS — S0183XA Puncture wound without foreign body of other part of head, initial encounter: Secondary | ICD-10-CM | POA: Diagnosis not present

## 2020-06-20 DIAGNOSIS — Z930 Tracheostomy status: Secondary | ICD-10-CM | POA: Diagnosis not present

## 2020-06-20 DIAGNOSIS — T1490XA Injury, unspecified, initial encounter: Secondary | ICD-10-CM | POA: Diagnosis not present

## 2020-06-20 DIAGNOSIS — Z931 Gastrostomy status: Secondary | ICD-10-CM | POA: Diagnosis not present

## 2020-07-02 DIAGNOSIS — T1490XA Injury, unspecified, initial encounter: Secondary | ICD-10-CM | POA: Diagnosis not present

## 2020-07-02 DIAGNOSIS — Z930 Tracheostomy status: Secondary | ICD-10-CM | POA: Diagnosis not present

## 2020-07-12 ENCOUNTER — Emergency Department (HOSPITAL_COMMUNITY)
Admission: EM | Admit: 2020-07-12 | Discharge: 2020-07-12 | Disposition: A | Discharge status: Home / Self Care | Payer: Medicare Other | Attending: Emergency Medicine | Admitting: Emergency Medicine

## 2020-07-12 ENCOUNTER — Other Ambulatory Visit: Payer: Self-pay

## 2020-07-12 DIAGNOSIS — R0602 Shortness of breath: Secondary | ICD-10-CM | POA: Diagnosis not present

## 2020-07-12 DIAGNOSIS — Z5321 Procedure and treatment not carried out due to patient leaving prior to being seen by health care provider: Secondary | ICD-10-CM | POA: Diagnosis not present

## 2020-07-14 DIAGNOSIS — Z930 Tracheostomy status: Secondary | ICD-10-CM | POA: Diagnosis not present

## 2020-07-14 DIAGNOSIS — Z87891 Personal history of nicotine dependence: Secondary | ICD-10-CM | POA: Diagnosis not present

## 2020-07-14 DIAGNOSIS — J398 Other specified diseases of upper respiratory tract: Secondary | ICD-10-CM | POA: Diagnosis not present

## 2020-07-26 DIAGNOSIS — Z430 Encounter for attention to tracheostomy: Secondary | ICD-10-CM | POA: Diagnosis not present

## 2020-08-02 DIAGNOSIS — T1490XA Injury, unspecified, initial encounter: Secondary | ICD-10-CM | POA: Diagnosis not present

## 2020-08-02 DIAGNOSIS — Z930 Tracheostomy status: Secondary | ICD-10-CM | POA: Diagnosis not present

## 2020-08-20 DIAGNOSIS — R1310 Dysphagia, unspecified: Secondary | ICD-10-CM | POA: Diagnosis not present

## 2020-08-20 DIAGNOSIS — C760 Malignant neoplasm of head, face and neck: Secondary | ICD-10-CM | POA: Diagnosis not present

## 2020-08-23 DIAGNOSIS — S0292XS Unspecified fracture of facial bones, sequela: Secondary | ICD-10-CM | POA: Diagnosis not present

## 2020-08-23 DIAGNOSIS — K0889 Other specified disorders of teeth and supporting structures: Secondary | ICD-10-CM | POA: Diagnosis not present

## 2020-08-23 DIAGNOSIS — Z430 Encounter for attention to tracheostomy: Secondary | ICD-10-CM | POA: Diagnosis not present

## 2020-08-27 DIAGNOSIS — S0292XD Unspecified fracture of facial bones, subsequent encounter for fracture with routine healing: Secondary | ICD-10-CM | POA: Diagnosis not present

## 2020-09-02 DIAGNOSIS — T1490XA Injury, unspecified, initial encounter: Secondary | ICD-10-CM | POA: Diagnosis not present

## 2020-09-02 DIAGNOSIS — Z930 Tracheostomy status: Secondary | ICD-10-CM | POA: Diagnosis not present

## 2020-09-18 DIAGNOSIS — S0292XD Unspecified fracture of facial bones, subsequent encounter for fracture with routine healing: Secondary | ICD-10-CM | POA: Diagnosis not present

## 2020-09-18 DIAGNOSIS — J3489 Other specified disorders of nose and nasal sinuses: Secondary | ICD-10-CM | POA: Diagnosis not present

## 2020-10-02 DIAGNOSIS — T1490XA Injury, unspecified, initial encounter: Secondary | ICD-10-CM | POA: Diagnosis not present

## 2020-10-02 DIAGNOSIS — Z930 Tracheostomy status: Secondary | ICD-10-CM | POA: Diagnosis not present

## 2020-10-04 DIAGNOSIS — K644 Residual hemorrhoidal skin tags: Secondary | ICD-10-CM | POA: Diagnosis not present

## 2020-10-04 DIAGNOSIS — W3400XA Accidental discharge from unspecified firearms or gun, initial encounter: Secondary | ICD-10-CM | POA: Diagnosis not present

## 2020-10-04 DIAGNOSIS — S0292XS Unspecified fracture of facial bones, sequela: Secondary | ICD-10-CM | POA: Diagnosis not present

## 2020-10-15 DIAGNOSIS — S0292XD Unspecified fracture of facial bones, subsequent encounter for fracture with routine healing: Secondary | ICD-10-CM | POA: Diagnosis not present

## 2020-10-15 DIAGNOSIS — Z01818 Encounter for other preprocedural examination: Secondary | ICD-10-CM | POA: Diagnosis not present

## 2020-10-15 DIAGNOSIS — S0292XS Unspecified fracture of facial bones, sequela: Secondary | ICD-10-CM | POA: Diagnosis not present

## 2020-11-02 DIAGNOSIS — T1490XA Injury, unspecified, initial encounter: Secondary | ICD-10-CM | POA: Diagnosis not present

## 2020-11-02 DIAGNOSIS — Z930 Tracheostomy status: Secondary | ICD-10-CM | POA: Diagnosis not present

## 2020-12-02 DIAGNOSIS — T1490XA Injury, unspecified, initial encounter: Secondary | ICD-10-CM | POA: Diagnosis not present

## 2020-12-02 DIAGNOSIS — Z930 Tracheostomy status: Secondary | ICD-10-CM | POA: Diagnosis not present

## 2021-01-02 DIAGNOSIS — Z930 Tracheostomy status: Secondary | ICD-10-CM | POA: Diagnosis not present

## 2021-01-02 DIAGNOSIS — T1490XA Injury, unspecified, initial encounter: Secondary | ICD-10-CM | POA: Diagnosis not present

## 2021-01-21 DIAGNOSIS — R634 Abnormal weight loss: Secondary | ICD-10-CM | POA: Diagnosis not present

## 2021-01-21 DIAGNOSIS — Z6820 Body mass index (BMI) 20.0-20.9, adult: Secondary | ICD-10-CM | POA: Diagnosis not present

## 2021-01-21 DIAGNOSIS — S02401D Maxillary fracture, unspecified, subsequent encounter for fracture with routine healing: Secondary | ICD-10-CM | POA: Diagnosis not present

## 2021-01-21 DIAGNOSIS — S0292XD Unspecified fracture of facial bones, subsequent encounter for fracture with routine healing: Secondary | ICD-10-CM | POA: Diagnosis not present

## 2021-01-21 DIAGNOSIS — Z87891 Personal history of nicotine dependence: Secondary | ICD-10-CM | POA: Diagnosis not present

## 2021-01-21 DIAGNOSIS — J320 Chronic maxillary sinusitis: Secondary | ICD-10-CM | POA: Diagnosis not present

## 2021-01-22 DIAGNOSIS — R1310 Dysphagia, unspecified: Secondary | ICD-10-CM | POA: Diagnosis not present

## 2021-01-22 DIAGNOSIS — C760 Malignant neoplasm of head, face and neck: Secondary | ICD-10-CM | POA: Diagnosis not present

## 2021-02-04 DIAGNOSIS — K640 First degree hemorrhoids: Secondary | ICD-10-CM | POA: Diagnosis not present

## 2021-02-04 DIAGNOSIS — K648 Other hemorrhoids: Secondary | ICD-10-CM | POA: Diagnosis not present

## 2021-02-04 DIAGNOSIS — Z87891 Personal history of nicotine dependence: Secondary | ICD-10-CM | POA: Diagnosis not present

## 2021-02-04 DIAGNOSIS — K644 Residual hemorrhoidal skin tags: Secondary | ICD-10-CM | POA: Diagnosis not present

## 2021-02-04 DIAGNOSIS — Z880 Allergy status to penicillin: Secondary | ICD-10-CM | POA: Diagnosis not present

## 2021-02-06 DIAGNOSIS — S02401D Maxillary fracture, unspecified, subsequent encounter for fracture with routine healing: Secondary | ICD-10-CM | POA: Diagnosis not present

## 2021-02-06 DIAGNOSIS — S0292XA Unspecified fracture of facial bones, initial encounter for closed fracture: Secondary | ICD-10-CM | POA: Diagnosis not present

## 2021-02-06 DIAGNOSIS — Z9889 Other specified postprocedural states: Secondary | ICD-10-CM | POA: Diagnosis not present

## 2021-02-06 DIAGNOSIS — K029 Dental caries, unspecified: Secondary | ICD-10-CM | POA: Diagnosis not present

## 2021-02-06 DIAGNOSIS — S0292XS Unspecified fracture of facial bones, sequela: Secondary | ICD-10-CM | POA: Diagnosis not present

## 2021-02-06 DIAGNOSIS — Z930 Tracheostomy status: Secondary | ICD-10-CM | POA: Diagnosis not present

## 2021-02-06 DIAGNOSIS — Z428 Encounter for other plastic and reconstructive surgery following medical procedure or healed injury: Secondary | ICD-10-CM | POA: Diagnosis not present

## 2021-02-06 DIAGNOSIS — M950 Acquired deformity of nose: Secondary | ICD-10-CM | POA: Diagnosis not present

## 2021-02-06 DIAGNOSIS — M952 Other acquired deformity of head: Secondary | ICD-10-CM | POA: Diagnosis not present

## 2021-02-06 DIAGNOSIS — J449 Chronic obstructive pulmonary disease, unspecified: Secondary | ICD-10-CM | POA: Diagnosis not present

## 2021-02-06 DIAGNOSIS — S0292XD Unspecified fracture of facial bones, subsequent encounter for fracture with routine healing: Secondary | ICD-10-CM | POA: Diagnosis not present

## 2021-02-06 DIAGNOSIS — Z20822 Contact with and (suspected) exposure to covid-19: Secondary | ICD-10-CM | POA: Diagnosis not present

## 2021-02-06 DIAGNOSIS — L905 Scar conditions and fibrosis of skin: Secondary | ICD-10-CM | POA: Diagnosis not present

## 2021-02-06 DIAGNOSIS — Z87891 Personal history of nicotine dependence: Secondary | ICD-10-CM | POA: Diagnosis not present

## 2021-02-19 DIAGNOSIS — S0292XS Unspecified fracture of facial bones, sequela: Secondary | ICD-10-CM | POA: Diagnosis not present

## 2021-02-19 DIAGNOSIS — J3489 Other specified disorders of nose and nasal sinuses: Secondary | ICD-10-CM | POA: Diagnosis not present

## 2021-02-19 DIAGNOSIS — Z87891 Personal history of nicotine dependence: Secondary | ICD-10-CM | POA: Diagnosis not present

## 2021-03-05 DIAGNOSIS — S0292XS Unspecified fracture of facial bones, sequela: Secondary | ICD-10-CM | POA: Diagnosis not present

## 2021-03-05 DIAGNOSIS — Z9889 Other specified postprocedural states: Secondary | ICD-10-CM | POA: Diagnosis not present

## 2021-03-21 DIAGNOSIS — Z87891 Personal history of nicotine dependence: Secondary | ICD-10-CM | POA: Diagnosis not present

## 2021-03-21 DIAGNOSIS — S0292XD Unspecified fracture of facial bones, subsequent encounter for fracture with routine healing: Secondary | ICD-10-CM | POA: Diagnosis not present

## 2021-04-15 DIAGNOSIS — Z9889 Other specified postprocedural states: Secondary | ICD-10-CM | POA: Diagnosis not present

## 2021-04-15 DIAGNOSIS — S0292XD Unspecified fracture of facial bones, subsequent encounter for fracture with routine healing: Secondary | ICD-10-CM | POA: Diagnosis not present

## 2021-04-16 DIAGNOSIS — C760 Malignant neoplasm of head, face and neck: Secondary | ICD-10-CM | POA: Diagnosis not present

## 2021-04-16 DIAGNOSIS — R1310 Dysphagia, unspecified: Secondary | ICD-10-CM | POA: Diagnosis not present

## 2021-05-13 DIAGNOSIS — S0292XD Unspecified fracture of facial bones, subsequent encounter for fracture with routine healing: Secondary | ICD-10-CM | POA: Diagnosis not present

## 2021-05-13 DIAGNOSIS — S0292XS Unspecified fracture of facial bones, sequela: Secondary | ICD-10-CM | POA: Diagnosis not present

## 2021-05-13 DIAGNOSIS — Z9889 Other specified postprocedural states: Secondary | ICD-10-CM | POA: Diagnosis not present

## 2021-08-02 DIAGNOSIS — M950 Acquired deformity of nose: Secondary | ICD-10-CM | POA: Diagnosis not present

## 2021-08-02 DIAGNOSIS — S0183XD Puncture wound without foreign body of other part of head, subsequent encounter: Secondary | ICD-10-CM | POA: Diagnosis not present

## 2021-08-02 DIAGNOSIS — Z930 Tracheostomy status: Secondary | ICD-10-CM | POA: Diagnosis not present

## 2021-08-02 DIAGNOSIS — S0292XS Unspecified fracture of facial bones, sequela: Secondary | ICD-10-CM | POA: Diagnosis not present

## 2021-08-02 DIAGNOSIS — M952 Other acquired deformity of head: Secondary | ICD-10-CM | POA: Diagnosis not present

## 2021-08-02 DIAGNOSIS — J3489 Other specified disorders of nose and nasal sinuses: Secondary | ICD-10-CM | POA: Diagnosis not present

## 2021-08-22 DIAGNOSIS — S0282XD Fracture of other specified skull and facial bones, left side, subsequent encounter for fracture with routine healing: Secondary | ICD-10-CM | POA: Diagnosis not present

## 2021-08-30 DIAGNOSIS — S0183XA Puncture wound without foreign body of other part of head, initial encounter: Secondary | ICD-10-CM | POA: Diagnosis not present

## 2021-08-30 DIAGNOSIS — L905 Scar conditions and fibrosis of skin: Secondary | ICD-10-CM | POA: Diagnosis not present

## 2021-08-30 DIAGNOSIS — M950 Acquired deformity of nose: Secondary | ICD-10-CM | POA: Diagnosis not present

## 2021-09-05 DIAGNOSIS — S0183XD Puncture wound without foreign body of other part of head, subsequent encounter: Secondary | ICD-10-CM | POA: Diagnosis not present

## 2021-09-18 DIAGNOSIS — R1310 Dysphagia, unspecified: Secondary | ICD-10-CM | POA: Diagnosis not present

## 2021-09-18 DIAGNOSIS — C760 Malignant neoplasm of head, face and neck: Secondary | ICD-10-CM | POA: Diagnosis not present

## 2021-09-19 DIAGNOSIS — S0292XD Unspecified fracture of facial bones, subsequent encounter for fracture with routine healing: Secondary | ICD-10-CM | POA: Diagnosis not present

## 2021-09-19 DIAGNOSIS — Z9889 Other specified postprocedural states: Secondary | ICD-10-CM | POA: Diagnosis not present

## 2022-01-09 DIAGNOSIS — E785 Hyperlipidemia, unspecified: Secondary | ICD-10-CM | POA: Diagnosis not present

## 2022-01-09 DIAGNOSIS — Z79899 Other long term (current) drug therapy: Secondary | ICD-10-CM | POA: Diagnosis not present

## 2022-01-09 DIAGNOSIS — Z79891 Long term (current) use of opiate analgesic: Secondary | ICD-10-CM | POA: Diagnosis not present

## 2022-01-09 DIAGNOSIS — E039 Hypothyroidism, unspecified: Secondary | ICD-10-CM | POA: Diagnosis not present

## 2022-01-17 DIAGNOSIS — Z79899 Other long term (current) drug therapy: Secondary | ICD-10-CM | POA: Diagnosis not present

## 2022-01-17 DIAGNOSIS — Z79891 Long term (current) use of opiate analgesic: Secondary | ICD-10-CM | POA: Diagnosis not present

## 2022-01-31 DIAGNOSIS — Z79891 Long term (current) use of opiate analgesic: Secondary | ICD-10-CM | POA: Diagnosis not present

## 2022-01-31 DIAGNOSIS — Z79899 Other long term (current) drug therapy: Secondary | ICD-10-CM | POA: Diagnosis not present

## 2022-02-03 DIAGNOSIS — S0292XS Unspecified fracture of facial bones, sequela: Secondary | ICD-10-CM | POA: Diagnosis not present

## 2022-02-03 DIAGNOSIS — Z9889 Other specified postprocedural states: Secondary | ICD-10-CM | POA: Diagnosis not present

## 2022-02-14 DIAGNOSIS — Z5181 Encounter for therapeutic drug level monitoring: Secondary | ICD-10-CM | POA: Diagnosis not present

## 2022-02-14 DIAGNOSIS — Z79899 Other long term (current) drug therapy: Secondary | ICD-10-CM | POA: Diagnosis not present

## 2022-02-18 DIAGNOSIS — H04431 Chronic lacrimal mucocele of right lacrimal passage: Secondary | ICD-10-CM | POA: Diagnosis not present

## 2022-02-18 DIAGNOSIS — Z431 Encounter for attention to gastrostomy: Secondary | ICD-10-CM | POA: Diagnosis not present

## 2022-02-18 DIAGNOSIS — S0292XA Unspecified fracture of facial bones, initial encounter for closed fracture: Secondary | ICD-10-CM | POA: Diagnosis not present

## 2022-02-18 DIAGNOSIS — Z9889 Other specified postprocedural states: Secondary | ICD-10-CM | POA: Diagnosis not present

## 2022-02-18 DIAGNOSIS — L928 Other granulomatous disorders of the skin and subcutaneous tissue: Secondary | ICD-10-CM | POA: Diagnosis not present

## 2022-02-18 DIAGNOSIS — S0292XS Unspecified fracture of facial bones, sequela: Secondary | ICD-10-CM | POA: Diagnosis not present

## 2022-02-18 DIAGNOSIS — J341 Cyst and mucocele of nose and nasal sinus: Secondary | ICD-10-CM | POA: Diagnosis not present

## 2022-02-18 DIAGNOSIS — Z931 Gastrostomy status: Secondary | ICD-10-CM | POA: Diagnosis not present

## 2022-02-18 DIAGNOSIS — Z87891 Personal history of nicotine dependence: Secondary | ICD-10-CM | POA: Diagnosis not present

## 2022-02-28 DIAGNOSIS — Z5181 Encounter for therapeutic drug level monitoring: Secondary | ICD-10-CM | POA: Diagnosis not present

## 2022-02-28 DIAGNOSIS — Z79899 Other long term (current) drug therapy: Secondary | ICD-10-CM | POA: Diagnosis not present

## 2022-03-06 DIAGNOSIS — H1031 Unspecified acute conjunctivitis, right eye: Secondary | ICD-10-CM | POA: Diagnosis not present

## 2022-03-06 DIAGNOSIS — M791 Myalgia, unspecified site: Secondary | ICD-10-CM | POA: Diagnosis not present

## 2022-03-06 DIAGNOSIS — Z20822 Contact with and (suspected) exposure to covid-19: Secondary | ICD-10-CM | POA: Diagnosis not present

## 2022-03-28 DIAGNOSIS — Z79899 Other long term (current) drug therapy: Secondary | ICD-10-CM | POA: Diagnosis not present

## 2022-03-28 DIAGNOSIS — Z5181 Encounter for therapeutic drug level monitoring: Secondary | ICD-10-CM | POA: Diagnosis not present

## 2022-04-22 ENCOUNTER — Emergency Department (HOSPITAL_COMMUNITY)
Admission: EM | Admit: 2022-04-22 | Discharge: 2022-04-22 | Disposition: A | Discharge status: Home / Self Care | Payer: Medicare Other | Attending: Emergency Medicine | Admitting: Emergency Medicine

## 2022-04-22 ENCOUNTER — Other Ambulatory Visit: Payer: Self-pay

## 2022-04-22 ENCOUNTER — Emergency Department (HOSPITAL_COMMUNITY): Payer: Medicare Other

## 2022-04-22 ENCOUNTER — Encounter (HOSPITAL_COMMUNITY): Payer: Self-pay | Admitting: Emergency Medicine

## 2022-04-22 DIAGNOSIS — R109 Unspecified abdominal pain: Secondary | ICD-10-CM | POA: Insufficient documentation

## 2022-04-22 DIAGNOSIS — R1012 Left upper quadrant pain: Secondary | ICD-10-CM | POA: Insufficient documentation

## 2022-04-22 DIAGNOSIS — Z5321 Procedure and treatment not carried out due to patient leaving prior to being seen by health care provider: Secondary | ICD-10-CM | POA: Insufficient documentation

## 2022-04-22 HISTORY — DX: Accidental discharge from unspecified firearms or gun, initial encounter: W34.00XA

## 2022-04-22 HISTORY — DX: Unspecified firearm discharge, undetermined intent, initial encounter: Y24.9XXA

## 2022-04-22 LAB — COMPREHENSIVE METABOLIC PANEL
ALT: 10 U/L (ref 0–44)
AST: 13 U/L — ABNORMAL LOW (ref 15–41)
Albumin: 3.9 g/dL (ref 3.5–5.0)
Alkaline Phosphatase: 53 U/L (ref 38–126)
Anion gap: 5 (ref 5–15)
BUN: 14 mg/dL (ref 6–20)
CO2: 30 mmol/L (ref 22–32)
Calcium: 8.7 mg/dL — ABNORMAL LOW (ref 8.9–10.3)
Chloride: 103 mmol/L (ref 98–111)
Creatinine, Ser: 1.16 mg/dL (ref 0.61–1.24)
GFR, Estimated: >60 mL/min (ref >60)
Glucose, Bld: 82 mg/dL (ref 70–99)
Potassium: 3.6 mmol/L (ref 3.5–5.1)
Sodium: 138 mmol/L (ref 135–145)
Total Bilirubin: 0.2 mg/dL — ABNORMAL LOW (ref 0.3–1.2)
Total Protein: 6.9 g/dL (ref 6.5–8.1)

## 2022-04-22 LAB — CBC WITH DIFFERENTIAL/PLATELET
Abs Immature Granulocytes: 0.02 10*3/uL (ref 0.00–0.07)
Basophils Absolute: 0.1 10*3/uL (ref 0.0–0.1)
Basophils Relative: 1 %
Eosinophils Absolute: 0.2 10*3/uL (ref 0.0–0.5)
Eosinophils Relative: 3 %
HCT: 35.6 % — ABNORMAL LOW (ref 39.0–52.0)
Hemoglobin: 11.8 g/dL — ABNORMAL LOW (ref 13.0–17.0)
Immature Granulocytes: 0 %
Lymphocytes Relative: 39 %
Lymphs Abs: 2.5 10*3/uL (ref 0.7–4.0)
MCH: 29.4 pg (ref 26.0–34.0)
MCHC: 33.1 g/dL (ref 30.0–36.0)
MCV: 88.8 fL (ref 80.0–100.0)
Monocytes Absolute: 0.5 10*3/uL (ref 0.1–1.0)
Monocytes Relative: 7 %
Neutro Abs: 3.1 10*3/uL (ref 1.7–7.7)
Neutrophils Relative %: 50 %
Platelets: 270 10*3/uL (ref 150–400)
RBC: 4.01 MIL/uL — ABNORMAL LOW (ref 4.22–5.81)
RDW: 13.7 % (ref 11.5–15.5)
WBC: 6.3 10*3/uL (ref 4.0–10.5)
nRBC: 0 % (ref 0.0–0.2)

## 2022-04-22 MED ORDER — IOHEXOL 300 MG/ML  SOLN
100.0000 mL | Freq: Once | INTRAMUSCULAR | Status: AC | PRN
  Administered 2022-04-22: 100 mL via INTRAVENOUS

## 2022-04-22 NOTE — ED Triage Notes (Signed)
Pt c/o abd pain in area where he used to have feeding tube. Pt denies any n/v/d.   ?  ?  ?  ? ?

## 2022-04-22 NOTE — ED Triage Notes (Signed)
Pt c/o abd pain in area where he used to have feeding tube. Pt denies any n/v/d.  ?
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
## 2022-04-22 NOTE — Discharge Instructions (Signed)
The testing did not show any complications from your recent G-tube removal site.  There are no other problems seen.  Use Tylenol as needed for pain.  Follow-up with the doctor of your choice as needed for problems. ?

## 2022-04-25 DIAGNOSIS — Z5181 Encounter for therapeutic drug level monitoring: Secondary | ICD-10-CM | POA: Diagnosis not present

## 2022-04-25 DIAGNOSIS — Z79899 Other long term (current) drug therapy: Secondary | ICD-10-CM | POA: Diagnosis not present

## 2022-05-23 DIAGNOSIS — Z7901 Long term (current) use of anticoagulants: Secondary | ICD-10-CM | POA: Diagnosis not present

## 2022-05-23 DIAGNOSIS — Z79899 Other long term (current) drug therapy: Secondary | ICD-10-CM | POA: Diagnosis not present

## 2022-05-23 DIAGNOSIS — Z5181 Encounter for therapeutic drug level monitoring: Secondary | ICD-10-CM | POA: Diagnosis not present

## 2022-06-20 DIAGNOSIS — Z5181 Encounter for therapeutic drug level monitoring: Secondary | ICD-10-CM | POA: Diagnosis not present

## 2022-06-20 DIAGNOSIS — Z79899 Other long term (current) drug therapy: Secondary | ICD-10-CM | POA: Diagnosis not present

## 2022-07-14 DIAGNOSIS — Z79899 Other long term (current) drug therapy: Secondary | ICD-10-CM | POA: Diagnosis not present

## 2022-07-14 DIAGNOSIS — Z5181 Encounter for therapeutic drug level monitoring: Secondary | ICD-10-CM | POA: Diagnosis not present

## 2022-07-23 DIAGNOSIS — Z872 Personal history of diseases of the skin and subcutaneous tissue: Secondary | ICD-10-CM | POA: Diagnosis not present

## 2022-07-23 DIAGNOSIS — Z8781 Personal history of (healed) traumatic fracture: Secondary | ICD-10-CM | POA: Diagnosis not present

## 2022-07-23 DIAGNOSIS — Z09 Encounter for follow-up examination after completed treatment for conditions other than malignant neoplasm: Secondary | ICD-10-CM | POA: Diagnosis not present

## 2022-08-18 DIAGNOSIS — Z5181 Encounter for therapeutic drug level monitoring: Secondary | ICD-10-CM | POA: Diagnosis not present

## 2022-08-19 DIAGNOSIS — Z79899 Other long term (current) drug therapy: Secondary | ICD-10-CM | POA: Diagnosis not present

## 2022-08-25 ENCOUNTER — Emergency Department (HOSPITAL_COMMUNITY)
Admission: EM | Admit: 2022-08-25 | Discharge: 2022-08-27 | Disposition: A | Discharge status: Other Acute Inpatient Hospital | Payer: Medicare Other | Source: Home / Self Care | Attending: Emergency Medicine | Admitting: Emergency Medicine

## 2022-08-25 ENCOUNTER — Encounter (HOSPITAL_COMMUNITY): Payer: Self-pay | Admitting: Emergency Medicine

## 2022-08-25 ENCOUNTER — Other Ambulatory Visit: Payer: Self-pay

## 2022-08-25 ENCOUNTER — Emergency Department (HOSPITAL_COMMUNITY)
Admission: EM | Admit: 2022-08-25 | Discharge: 2022-08-25 | Disposition: A | Discharge status: Home / Self Care | Payer: Medicare Other | Attending: Emergency Medicine | Admitting: Emergency Medicine

## 2022-08-25 DIAGNOSIS — G4700 Insomnia, unspecified: Secondary | ICD-10-CM

## 2022-08-25 DIAGNOSIS — R45851 Suicidal ideations: Secondary | ICD-10-CM | POA: Insufficient documentation

## 2022-08-25 DIAGNOSIS — F1290 Cannabis use, unspecified, uncomplicated: Secondary | ICD-10-CM | POA: Insufficient documentation

## 2022-08-25 DIAGNOSIS — F259 Schizoaffective disorder, unspecified: Secondary | ICD-10-CM | POA: Insufficient documentation

## 2022-08-25 DIAGNOSIS — G478 Other sleep disorders: Secondary | ICD-10-CM | POA: Diagnosis present

## 2022-08-25 DIAGNOSIS — Z20822 Contact with and (suspected) exposure to covid-19: Secondary | ICD-10-CM | POA: Insufficient documentation

## 2022-08-25 DIAGNOSIS — F29 Unspecified psychosis not due to a substance or known physiological condition: Secondary | ICD-10-CM | POA: Insufficient documentation

## 2022-08-25 DIAGNOSIS — R0689 Other abnormalities of breathing: Secondary | ICD-10-CM | POA: Diagnosis not present

## 2022-08-25 DIAGNOSIS — R6889 Other general symptoms and signs: Secondary | ICD-10-CM | POA: Diagnosis not present

## 2022-08-25 DIAGNOSIS — Z743 Need for continuous supervision: Secondary | ICD-10-CM | POA: Diagnosis not present

## 2022-08-25 DIAGNOSIS — I499 Cardiac arrhythmia, unspecified: Secondary | ICD-10-CM | POA: Diagnosis not present

## 2022-08-25 LAB — CBC WITH DIFFERENTIAL/PLATELET
Abs Immature Granulocytes: 0.01 10*3/uL (ref 0.00–0.07)
Basophils Absolute: 0 10*3/uL (ref 0.0–0.1)
Basophils Relative: 1 %
Eosinophils Absolute: 0 10*3/uL (ref 0.0–0.5)
Eosinophils Relative: 1 %
HCT: 35.8 % — ABNORMAL LOW (ref 39.0–52.0)
Hemoglobin: 12 g/dL — ABNORMAL LOW (ref 13.0–17.0)
Immature Granulocytes: 0 %
Lymphocytes Relative: 21 %
Lymphs Abs: 0.9 10*3/uL (ref 0.7–4.0)
MCH: 29.6 pg (ref 26.0–34.0)
MCHC: 33.5 g/dL (ref 30.0–36.0)
MCV: 88.2 fL (ref 80.0–100.0)
Monocytes Absolute: 0.3 10*3/uL (ref 0.1–1.0)
Monocytes Relative: 7 %
Neutro Abs: 3.1 10*3/uL (ref 1.7–7.7)
Neutrophils Relative %: 70 %
Platelets: 251 10*3/uL (ref 150–400)
RBC: 4.06 MIL/uL — ABNORMAL LOW (ref 4.22–5.81)
RDW: 13.4 % (ref 11.5–15.5)
WBC: 4.3 10*3/uL (ref 4.0–10.5)
nRBC: 0 % (ref 0.0–0.2)

## 2022-08-25 LAB — COMPREHENSIVE METABOLIC PANEL
ALT: 38 U/L (ref 0–44)
AST: 24 U/L (ref 15–41)
Albumin: 3.9 g/dL (ref 3.5–5.0)
Alkaline Phosphatase: 50 U/L (ref 38–126)
Anion gap: 7 (ref 5–15)
BUN: 16 mg/dL (ref 6–20)
CO2: 26 mmol/L (ref 22–32)
Calcium: 8.5 mg/dL — ABNORMAL LOW (ref 8.9–10.3)
Chloride: 106 mmol/L (ref 98–111)
Creatinine, Ser: 0.97 mg/dL (ref 0.61–1.24)
GFR, Estimated: >60 mL/min (ref >60)
Glucose, Bld: 89 mg/dL (ref 70–99)
Potassium: 3.7 mmol/L (ref 3.5–5.1)
Sodium: 139 mmol/L (ref 135–145)
Total Bilirubin: 0.8 mg/dL (ref 0.3–1.2)
Total Protein: 6.9 g/dL (ref 6.5–8.1)

## 2022-08-25 LAB — RAPID URINE DRUG SCREEN, HOSP PERFORMED
Amphetamines: NOT DETECTED
Barbiturates: NOT DETECTED
Benzodiazepines: POSITIVE — AB
Cocaine: NOT DETECTED
Opiates: NOT DETECTED
Tetrahydrocannabinol: POSITIVE — AB

## 2022-08-25 LAB — ETHANOL: Alcohol, Ethyl (B): <10 mg/dL (ref <10)

## 2022-08-25 MED ORDER — RISPERIDONE 1 MG PO TABS
0.5000 mg | ORAL_TABLET | Freq: Every day | ORAL | Status: DC
  Administered 2022-08-25: 0.5 mg via ORAL
  Filled 2022-08-25: qty 1

## 2022-08-25 MED ORDER — ALUM & MAG HYDROXIDE-SIMETH 200-200-20 MG/5ML PO SUSP: 30.0000 mL | Freq: Four times a day (QID) | ORAL | Status: DC | PRN

## 2022-08-25 MED ORDER — ASPIRIN 81 MG PO CHEW
CHEWABLE_TABLET | ORAL | Status: AC
  Filled 2022-08-25: qty 4

## 2022-08-25 MED ORDER — ZIPRASIDONE MESYLATE 20 MG IM SOLR
20.0000 mg | Freq: Once | INTRAMUSCULAR | Status: AC
  Administered 2022-08-25: 20 mg via INTRAMUSCULAR
  Filled 2022-08-25: qty 20

## 2022-08-25 MED ORDER — ACETAMINOPHEN 325 MG PO TABS: 650.0000 mg | ORAL_TABLET | ORAL | Status: DC | PRN

## 2022-08-25 MED ORDER — DIPHENHYDRAMINE HCL 50 MG/ML IJ SOLN
50.0000 mg | Freq: Once | INTRAMUSCULAR | Status: AC
  Administered 2022-08-25: 50 mg via INTRAMUSCULAR
  Filled 2022-08-25: qty 1

## 2022-08-25 MED ORDER — ONDANSETRON HCL 4 MG PO TABS: 4.0000 mg | ORAL_TABLET | Freq: Three times a day (TID) | ORAL | Status: DC | PRN

## 2022-08-25 MED ORDER — QUETIAPINE FUMARATE 100 MG PO TABS
100.0000 mg | ORAL_TABLET | Freq: Every day | ORAL | Status: DC
  Administered 2022-08-25: 100 mg via ORAL
  Filled 2022-08-25: qty 1

## 2022-08-25 MED ORDER — STERILE WATER FOR INJECTION IJ SOLN
INTRAMUSCULAR | Status: AC
  Filled 2022-08-25: qty 10

## 2022-08-25 MED ORDER — DROPERIDOL 2.5 MG/ML IJ SOLN
5.0000 mg | Freq: Once | INTRAMUSCULAR | Status: AC
  Administered 2022-08-25: 5 mg via INTRAMUSCULAR
  Filled 2022-08-25: qty 2

## 2022-08-25 MED ORDER — LUMATEPERONE TOSYLATE 42 MG PO CAPS
42.0000 mg | ORAL_CAPSULE | Freq: Every day | ORAL | Status: DC
  Filled 2022-08-25 (×4): qty 1

## 2022-08-25 MED ORDER — DIAZEPAM 5 MG PO TABS
5.0000 mg | ORAL_TABLET | Freq: Two times a day (BID) | ORAL | Status: DC | PRN
  Administered 2022-08-25 – 2022-08-26 (×2): 5 mg via ORAL
  Filled 2022-08-25 (×2): qty 1

## 2022-08-25 MED ORDER — BUPRENORPHINE HCL 2 MG SL SUBL
8.0000 mg | SUBLINGUAL_TABLET | Freq: Every day | SUBLINGUAL | Status: DC
  Administered 2022-08-26: 8 mg via SUBLINGUAL
  Filled 2022-08-25: qty 4

## 2022-08-25 MED ORDER — NITROGLYCERIN IN D5W 200-5 MCG/ML-% IV SOLN
INTRAVENOUS | Status: AC
  Filled 2022-08-25: qty 250

## 2022-08-25 NOTE — ED Notes (Signed)
Pt given meal tray. Still requesting meds
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
## 2022-08-25 NOTE — ED Notes (Signed)
Security remains at bedside due to pt being verbally aggressive with staff Pt has also punched garage door multiple times with security at bedside

## 2022-08-25 NOTE — Discharge Instructions (Signed)
Follow-up with behavioral health today as scheduled.
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
## 2022-08-25 NOTE — ED Notes (Signed)
Pt upset due not getting pain meds. I told pt the doctor would have to order them. Pt states he is going to sue me and wanted my last name. Security in room with pt at this time.

## 2022-08-25 NOTE — ED Triage Notes (Signed)
Pt bib EMS after he states he needs his Risperdal shot and he wants a drug test to prove to his girlfriend that he is not on drugs.

## 2022-08-25 NOTE — ED Notes (Signed)
TTS complete 
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
## 2022-08-25 NOTE — ED Notes (Signed)
Pt in bed, pt calm and cooperative, pt reports general body aches, pt states that he doesn't need anything for pain at this time.
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
## 2022-08-25 NOTE — ED Notes (Signed)
Pt in bed with eyes closed, resps even and unlabored.  
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
## 2022-08-25 NOTE — ED Triage Notes (Signed)
Pt to the ED under IVC stating he is a danger to himself with a history of Schizoaffective disorder.  IVC paperwork states the pt has stopped taking his prescribed medication.

## 2022-08-26 MED ORDER — LORAZEPAM 2 MG/ML IJ SOLN
INTRAMUSCULAR | Status: AC
  Administered 2022-08-26: 6 mg via INTRAMUSCULAR
  Filled 2022-08-26: qty 3

## 2022-08-26 MED ORDER — POLYETHYLENE GLYCOL 3350 17 G PO PACK
17.0000 g | PACK | Freq: Once | ORAL | Status: AC
  Administered 2022-08-26: 17 g via ORAL
  Filled 2022-08-26: qty 1

## 2022-08-26 MED ORDER — STERILE WATER FOR INJECTION IJ SOLN
INTRAMUSCULAR | Status: AC
  Filled 2022-08-26: qty 10

## 2022-08-26 MED ORDER — ZIPRASIDONE MESYLATE 20 MG IM SOLR
20.0000 mg | Freq: Once | INTRAMUSCULAR | Status: AC
  Administered 2022-08-26: 20 mg via INTRAMUSCULAR
  Filled 2022-08-26: qty 20

## 2022-08-26 MED ORDER — LORAZEPAM 2 MG/ML IJ SOLN
2.0000 mg | Freq: Once | INTRAMUSCULAR | Status: AC
  Administered 2022-08-26: 2 mg via INTRAMUSCULAR
  Filled 2022-08-26: qty 1

## 2022-08-26 MED ORDER — LORAZEPAM 2 MG/ML IJ SOLN: 6.0000 mg | Freq: Once | INTRAMUSCULAR | Status: AC

## 2022-08-26 MED ORDER — ZIPRASIDONE MESYLATE 20 MG IM SOLR
10.0000 mg | Freq: Once | INTRAMUSCULAR | Status: AC
  Administered 2022-08-26: 10 mg via INTRAMUSCULAR
  Filled 2022-08-26: qty 20

## 2022-08-26 NOTE — Progress Notes (Signed)
Inpatient Behavioral Health Placement  Pt meets inpatient criteria per Garrison Columbus, NP. There are no available beds at [facility] per [facility] Lynnda Shields, RN. Referral was sent to the following facilities;     Destination Service Provider Address Phone Fax  [facility]  [place], [place] [address] [place]  [facility]  [address] [place] [address] [phone]  [facility]  [place], [place] [address] [phone]  [facility]  [place] [address] [place]  [facility]  [address], [place] [address] [phone]  [place] [address], [place] [address] [phone]  [facility]  [address], [place] [address] [phone] [place]  [address] [place] [address] [phone] [place]  [address], [place] [address] [phone] [facility]  [address], [place] [address] [phone] [facility]  [address], [place] [address] [phone] [place], [place] [address] [phone] [facility]  [address], [place] [address] [phone]   Situation ongoing,  CSW will follow up.   Benjaman Kindler, MSW, LCSWA 08/26/2022  @ 11:26 AM

## 2022-08-26 NOTE — ED Provider Notes (Signed)
Called to patient's room this afternoon. Patient was agitated, yelling, spitting, urinated on the floor, throwing water, punched door several times. Patient given geodon 20 mg IM without any improvement in aggressive behavior/agitation. Subsequently given ativan 2 mg IM. Patient now lying in stretcher, resting, even respirations.   Audley Hose, MD    Audley Hose, MD 08/26/22 1556

## 2022-08-26 NOTE — ED Notes (Signed)
Held 2200 meds per Dr Sabra Heck. May give when he wakes. Blake Corona Edd Fabian

## 2022-08-26 NOTE — ED Notes (Signed)
Pt will not stay in room, out into hallway, up to nurses station, has been redirected multiple times with no success. Pt had crayons in the room, broke them. Becoming progressively more agitated screaming telling the sitters to do what he is demanding. RN notified and in room.

## 2022-08-26 NOTE — ED Notes (Signed)
Pt verbally aggressive with staff, is not redirectable at all, will not stay in room, spitting in the floor.
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
## 2022-08-26 NOTE — ED Notes (Signed)
Pt agitation and aggression upsetting patients in the surrounding rooms

## 2022-08-26 NOTE — ED Notes (Signed)
Pt took shower and changed scrubs

## 2022-08-26 NOTE — ED Notes (Addendum)
Pt was accepted to [facility] PENDING COVID-19   Pt meets inpatient criteria per Garrison Columbus, NP   Attending Physician will be Dr. Jonelle Sports   Report can be called to: [phone]-pager   Pt can arrive after: 9:00am   Care Team notified: Garrison Columbus, NP, and Ruel Favors, RN   Benjaman Kindler, MSW, [facility] 08/26/2022 4:22 PM
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
## 2022-08-26 NOTE — ED Notes (Signed)
Pt screaming in hallway, asked by multiple staff members to stop yelling and to go into room. Pt refuses. Pt further upsetting patients

## 2022-08-26 NOTE — ED Notes (Signed)
Pt starting to get irritated and agitated. Pt was able to be verbally deescalated at this time.

## 2022-08-26 NOTE — Progress Notes (Signed)
Pt was accept to [facility] PENDING COVID-19  Pt meets inpatient criteria per Garrison Columbus, NP  Attending Physician will be Dr. Jonelle Sports  Report can be called to: [phone]-pager  Pt can arrive after: 9:00am  Care Team notified: Garrison Columbus, NP, and Ruel Favors, RN  Benjaman Kindler, MSW, [facility] 08/26/2022 4:22 PM

## 2022-08-26 NOTE — Progress Notes (Signed)
Inpatient Behavioral Health Placement  Pt meets inpatient criteria per Garrison Columbus, NP. There are no available beds at [facility] per [facility] Lucia Gaskins, RN. Referral was sent to the following facilities;   Destination Service Provider Address Phone Fax  [facility]  [place], [place] [address] [place]  [facility]  [address] [place] [address] [phone]  [facility]  [place], [place] [address] [phone]  [facility]  [place] [address] [place]  [facility]  [address], [place] [address] [phone]  [place] [address] [place] [address] [phone]  [facility]  [address], [place] [address] [phone] [place]  [address] [place] [address] [phone] [place]  [address] [place] [address] [phone] [facility]  [address], [place] [address] [phone] [facility]  [address] [place] [phone] [place], [place] [address] [phone]    Situation ongoing,  CSW will follow up.   Benjaman Kindler, MSW, LCSWA 08/26/2022  @ 2:52 AM

## 2022-08-26 NOTE — ED Notes (Signed)
Pt sleeping. Rise and fall of chest noted. Blake Hardin

## 2022-08-26 NOTE — Progress Notes (Signed)
Pt pushed sitter and stated he needs to get out of here and then proceeded to walk to the bathroom, security and PD now at bedside.

## 2022-08-27 LAB — SARS CORONAVIRUS 2 BY RT PCR: SARS Coronavirus 2 by RT PCR: NEGATIVE

## 2022-08-27 MED ORDER — STERILE WATER FOR INJECTION IJ SOLN
INTRAMUSCULAR | Status: AC
  Filled 2022-08-27: qty 10

## 2022-08-27 MED ORDER — BUPRENORPHINE HCL 2 MG SL SUBL
8.0000 mg | SUBLINGUAL_TABLET | Freq: Once | SUBLINGUAL | Status: AC
  Administered 2022-08-27: 8 mg via SUBLINGUAL
  Filled 2022-08-27: qty 4

## 2022-08-27 MED ORDER — ZIPRASIDONE MESYLATE 20 MG IM SOLR
20.0000 mg | Freq: Once | INTRAMUSCULAR | Status: AC
  Administered 2022-08-27: 20 mg via INTRAMUSCULAR
  Filled 2022-08-27: qty 20

## 2022-08-27 NOTE — ED Provider Notes (Signed)
Patient being transferred to [facility] excepting is Blake Hardin.  EMTALA completed.  Patient being transferred to this facility for inpatient psychiatric care.   Fredia Sorrow, MD 08/27/22 [phone]

## 2022-08-27 NOTE — ED Notes (Addendum)
Pt requested to speak with the nurse and asked where he would be going this morning. This RN replied to pt and stated that he could go to [place] after 9 am this morning. Pt upset and stating that he does not want to go to [place]. This RN explained pt that there is no choice since patient is IVC'd. Pt requested to speak with charge nurse and was told the same thing by charge nurse. Pt then asked to speak with supervisor and [facility] called at this time and awaiting a call back.  Security is at the bedside and pt is rasing voice. EDP made aware and to order medication.

## 2022-09-02 DIAGNOSIS — R21 Rash and other nonspecific skin eruption: Secondary | ICD-10-CM | POA: Diagnosis not present

## 2022-09-05 ENCOUNTER — Telehealth: Payer: Self-pay

## 2022-09-05 NOTE — Telephone Encounter (Signed)
        Patient  visited [facility] on 9/20   Telephone encounter attempt :  1st  A HIPAA compliant voice message was left requesting a return call.  Instructed patient to call back    [place] [place] [facility]  [phone] [address], [place], [place] [address] Phone: [phone] Email: [email or web address]

## 2022-09-08 DIAGNOSIS — Z79899 Other long term (current) drug therapy: Secondary | ICD-10-CM | POA: Diagnosis not present

## 2022-09-13 DIAGNOSIS — M4602 Spinal enthesopathy, cervical region: Secondary | ICD-10-CM | POA: Diagnosis not present

## 2022-09-13 DIAGNOSIS — M4126 Other idiopathic scoliosis, lumbar region: Secondary | ICD-10-CM | POA: Diagnosis not present

## 2022-09-13 DIAGNOSIS — Z743 Need for continuous supervision: Secondary | ICD-10-CM | POA: Diagnosis not present

## 2022-09-13 DIAGNOSIS — S161XXA Strain of muscle, fascia and tendon at neck level, initial encounter: Secondary | ICD-10-CM | POA: Diagnosis not present

## 2022-09-13 DIAGNOSIS — M542 Cervicalgia: Secondary | ICD-10-CM | POA: Diagnosis not present

## 2022-09-13 DIAGNOSIS — M2578 Osteophyte, vertebrae: Secondary | ICD-10-CM | POA: Diagnosis not present

## 2022-09-13 DIAGNOSIS — M545 Low back pain, unspecified: Secondary | ICD-10-CM | POA: Diagnosis not present

## 2022-09-13 DIAGNOSIS — S199XXA Unspecified injury of neck, initial encounter: Secondary | ICD-10-CM | POA: Diagnosis not present

## 2022-09-13 DIAGNOSIS — M47812 Spondylosis without myelopathy or radiculopathy, cervical region: Secondary | ICD-10-CM | POA: Diagnosis not present

## 2022-09-13 DIAGNOSIS — R5381 Other malaise: Secondary | ICD-10-CM | POA: Diagnosis not present

## 2022-09-13 DIAGNOSIS — Z87891 Personal history of nicotine dependence: Secondary | ICD-10-CM | POA: Diagnosis not present

## 2022-09-15 DIAGNOSIS — Z87891 Personal history of nicotine dependence: Secondary | ICD-10-CM | POA: Diagnosis not present

## 2022-09-15 DIAGNOSIS — Z79899 Other long term (current) drug therapy: Secondary | ICD-10-CM | POA: Diagnosis not present

## 2022-09-15 DIAGNOSIS — Z5181 Encounter for therapeutic drug level monitoring: Secondary | ICD-10-CM | POA: Diagnosis not present

## 2022-09-15 DIAGNOSIS — Z880 Allergy status to penicillin: Secondary | ICD-10-CM | POA: Diagnosis not present

## 2022-09-15 DIAGNOSIS — Z91018 Allergy to other foods: Secondary | ICD-10-CM | POA: Diagnosis not present

## 2022-09-15 DIAGNOSIS — M542 Cervicalgia: Secondary | ICD-10-CM | POA: Diagnosis not present

## 2022-09-15 DIAGNOSIS — R5381 Other malaise: Secondary | ICD-10-CM | POA: Diagnosis not present

## 2022-09-15 DIAGNOSIS — I499 Cardiac arrhythmia, unspecified: Secondary | ICD-10-CM | POA: Diagnosis not present

## 2022-09-15 DIAGNOSIS — Z760 Encounter for issue of repeat prescription: Secondary | ICD-10-CM | POA: Diagnosis not present

## 2022-09-15 DIAGNOSIS — G894 Chronic pain syndrome: Secondary | ICD-10-CM | POA: Diagnosis not present

## 2022-09-15 DIAGNOSIS — R519 Headache, unspecified: Secondary | ICD-10-CM | POA: Diagnosis not present

## 2022-09-15 DIAGNOSIS — Z743 Need for continuous supervision: Secondary | ICD-10-CM | POA: Diagnosis not present

## 2022-09-16 DIAGNOSIS — Z87891 Personal history of nicotine dependence: Secondary | ICD-10-CM | POA: Diagnosis not present

## 2022-09-16 DIAGNOSIS — T730XXA Starvation, initial encounter: Secondary | ICD-10-CM | POA: Diagnosis not present

## 2022-09-16 DIAGNOSIS — G4709 Other insomnia: Secondary | ICD-10-CM | POA: Diagnosis not present

## 2022-09-16 DIAGNOSIS — Z880 Allergy status to penicillin: Secondary | ICD-10-CM | POA: Diagnosis not present

## 2022-09-16 DIAGNOSIS — Z139 Encounter for screening, unspecified: Secondary | ICD-10-CM | POA: Diagnosis not present

## 2022-09-16 DIAGNOSIS — Z91018 Allergy to other foods: Secondary | ICD-10-CM | POA: Diagnosis not present

## 2022-09-18 DIAGNOSIS — R238 Other skin changes: Secondary | ICD-10-CM | POA: Diagnosis not present

## 2022-09-18 DIAGNOSIS — Z5321 Procedure and treatment not carried out due to patient leaving prior to being seen by health care provider: Secondary | ICD-10-CM | POA: Diagnosis not present

## 2022-09-18 DIAGNOSIS — M79671 Pain in right foot: Secondary | ICD-10-CM | POA: Diagnosis not present

## 2022-09-18 DIAGNOSIS — Z87891 Personal history of nicotine dependence: Secondary | ICD-10-CM | POA: Diagnosis not present

## 2022-09-18 DIAGNOSIS — Z91018 Allergy to other foods: Secondary | ICD-10-CM | POA: Diagnosis not present

## 2022-09-18 DIAGNOSIS — M79672 Pain in left foot: Secondary | ICD-10-CM | POA: Diagnosis not present

## 2022-09-18 DIAGNOSIS — Z79899 Other long term (current) drug therapy: Secondary | ICD-10-CM | POA: Diagnosis not present

## 2022-09-18 DIAGNOSIS — Z880 Allergy status to penicillin: Secondary | ICD-10-CM | POA: Diagnosis not present

## 2022-09-19 DIAGNOSIS — M542 Cervicalgia: Secondary | ICD-10-CM | POA: Diagnosis not present

## 2022-09-19 DIAGNOSIS — S161XXA Strain of muscle, fascia and tendon at neck level, initial encounter: Secondary | ICD-10-CM | POA: Diagnosis not present

## 2022-09-19 DIAGNOSIS — Z880 Allergy status to penicillin: Secondary | ICD-10-CM | POA: Diagnosis not present

## 2022-09-19 DIAGNOSIS — M545 Low back pain, unspecified: Secondary | ICD-10-CM | POA: Diagnosis not present

## 2022-11-05 DIAGNOSIS — Z79899 Other long term (current) drug therapy: Secondary | ICD-10-CM | POA: Diagnosis not present

## 2022-11-05 DIAGNOSIS — F17210 Nicotine dependence, cigarettes, uncomplicated: Secondary | ICD-10-CM | POA: Diagnosis not present

## 2022-11-05 DIAGNOSIS — Z880 Allergy status to penicillin: Secondary | ICD-10-CM | POA: Diagnosis not present

## 2022-11-05 DIAGNOSIS — M950 Acquired deformity of nose: Secondary | ICD-10-CM | POA: Diagnosis not present

## 2022-11-05 DIAGNOSIS — G8929 Other chronic pain: Secondary | ICD-10-CM | POA: Diagnosis not present

## 2022-11-05 DIAGNOSIS — Z5321 Procedure and treatment not carried out due to patient leaving prior to being seen by health care provider: Secondary | ICD-10-CM | POA: Diagnosis not present

## 2022-11-05 DIAGNOSIS — Z760 Encounter for issue of repeat prescription: Secondary | ICD-10-CM | POA: Diagnosis not present

## 2022-11-05 DIAGNOSIS — Z743 Need for continuous supervision: Secondary | ICD-10-CM | POA: Diagnosis not present

## 2023-01-19 IMAGING — CT CT ABD-PELV W/ CM
2 of 5 series · 15 of 46 positions shown, 17 images · IV contrast (Omnipaque or Isovue)
Comparison: None Available.

CLINICAL DATA: Abdominal pain at site of prior PEG tube.

EXAM:
CT ABDOMEN AND PELVIS WITH CONTRAST
TECHNIQUE: Multidetector CT imaging of the abdomen and pelvis was performed
using the standard protocol following bolus administration of
intravenous contrast.

[Series 2: axial st · axial · 0.70mm/px · z∈[+1012,+1407]mm · 12 of 89 slices shown, 14 images]
[im 5/89  soft-tissue]
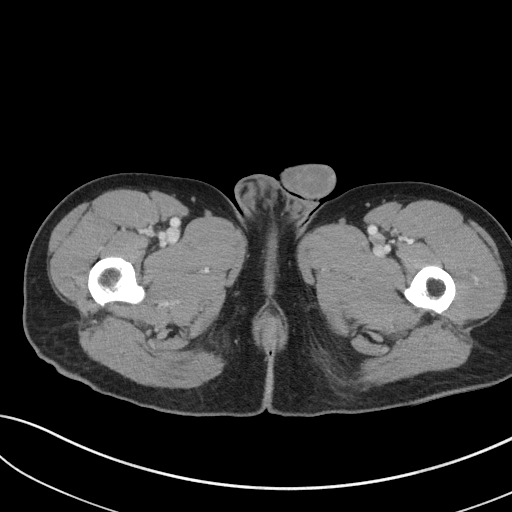
[im 5/89  bone]
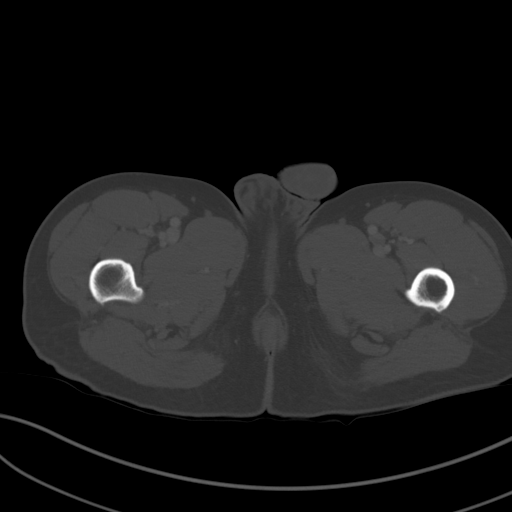
[im 15/89  soft-tissue]
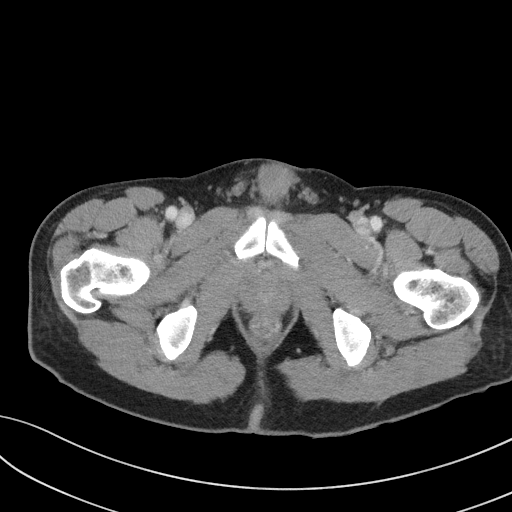
[im 20/89  soft-tissue]
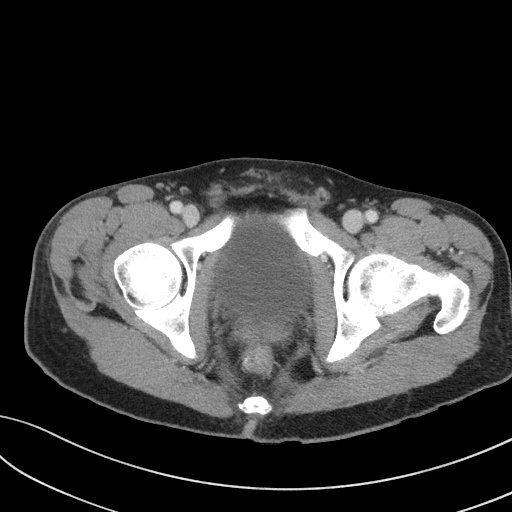
[im 25/89  soft-tissue]
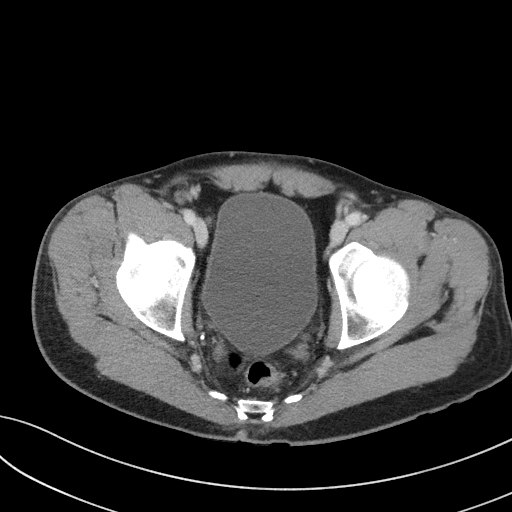
[im 35/89  soft-tissue]
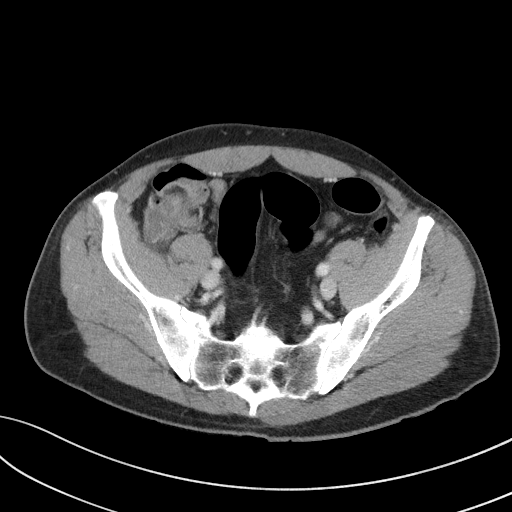
[im 40/89  soft-tissue]
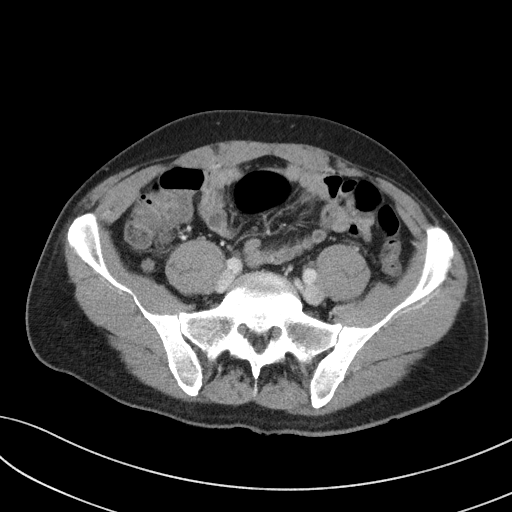
[im 49/89  soft-tissue]
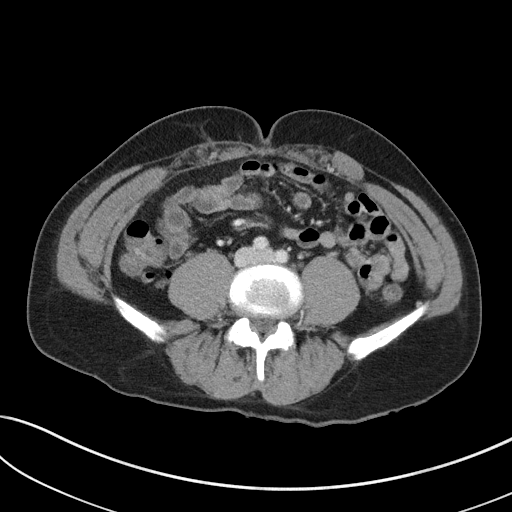
[im 54/89  soft-tissue]
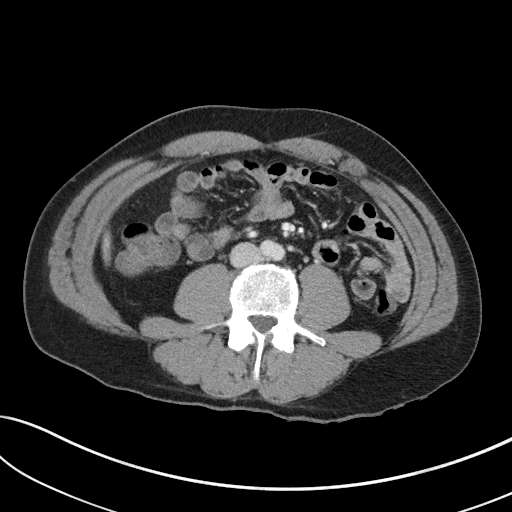
[im 64/89  soft-tissue]
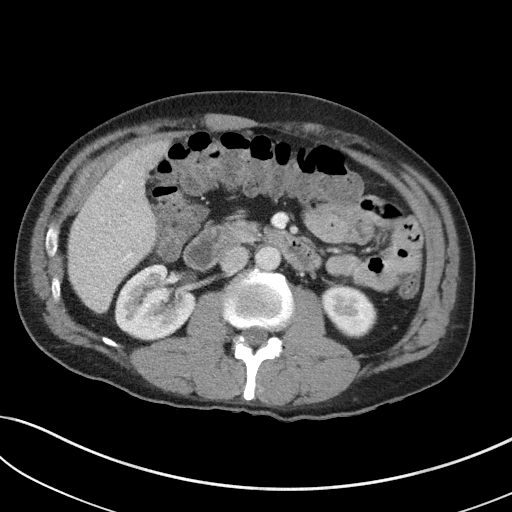
[im 64/89  bone]
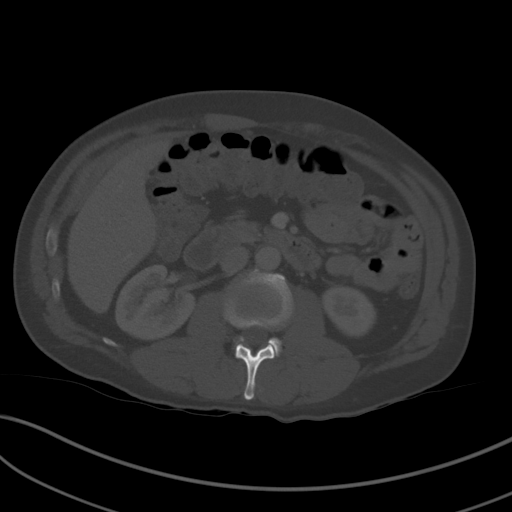
[im 69/89  soft-tissue]
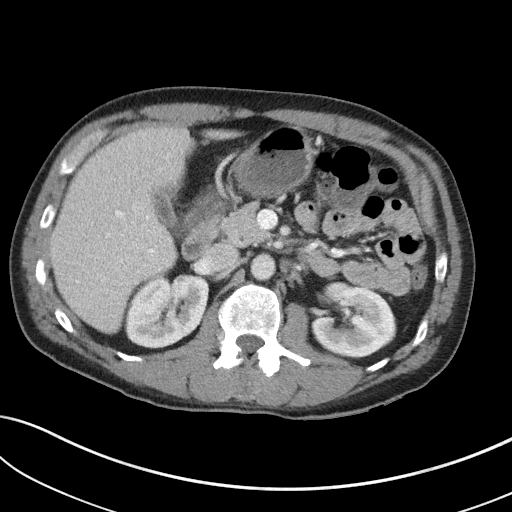
[im 74/89  soft-tissue]
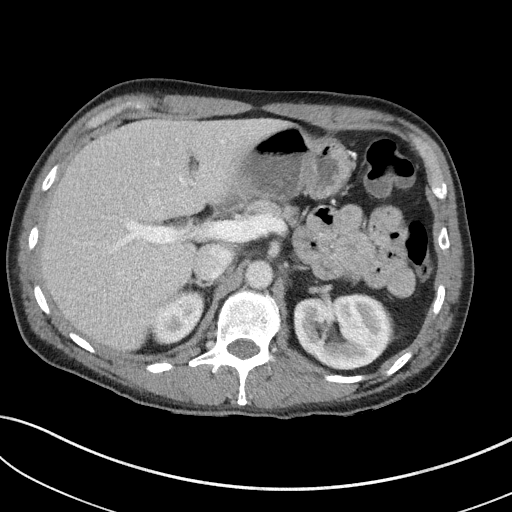
[im 84/89  soft-tissue]
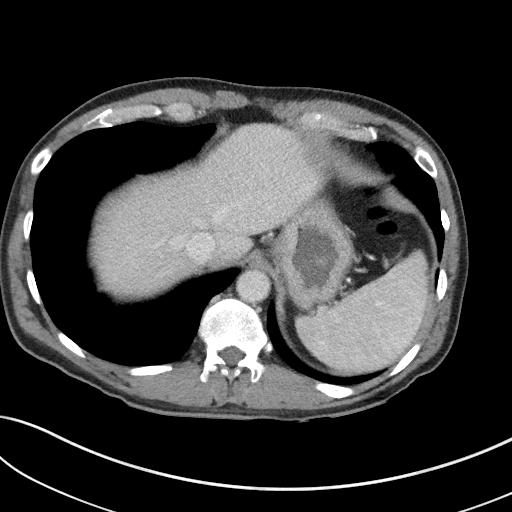

[Series 6: coronal st · coronal · 0.77mm/px · 3 of 109 slices shown]
[im 37/109  soft-tissue]
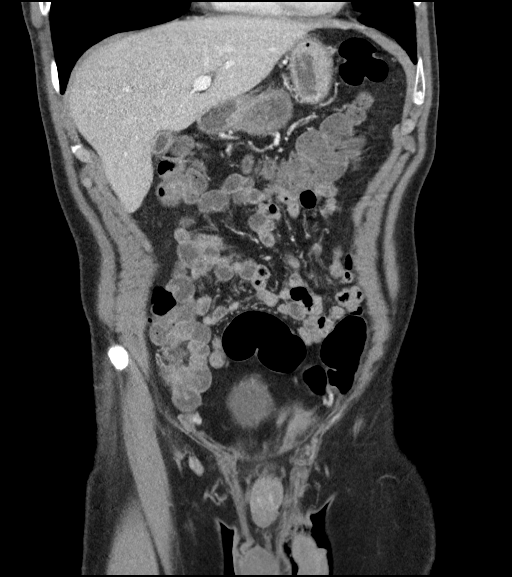
[im 49/109  soft-tissue]
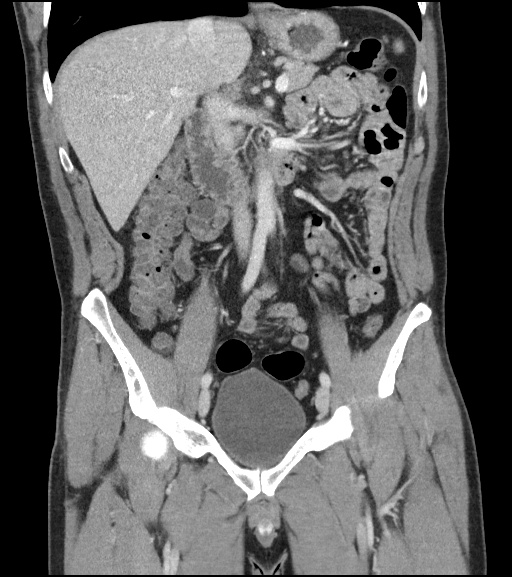
[im 61/109  soft-tissue]
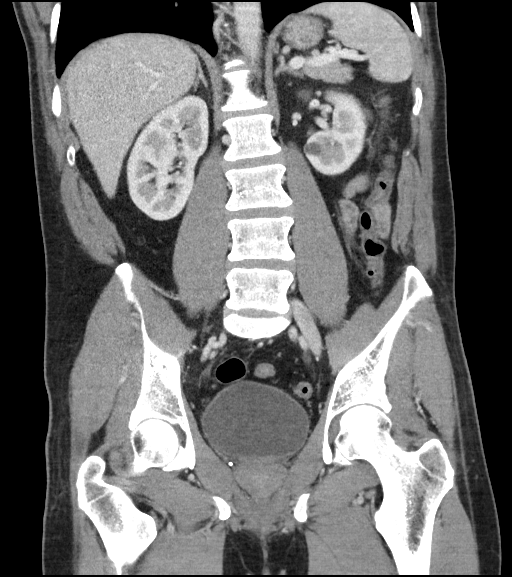

[15 of 46 positions shown; findings below may reference images not displayed]

RADIATION DOSE REDUCTION: This exam was performed according to the
departmental dose-optimization program which includes automated
exposure control, adjustment of the mA and/or kV according to
patient size and/or use of iterative reconstruction technique.

CONTRAST:  100mL OMNIPAQUE IOHEXOL 300 MG/ML  SOLN
FINDINGS: Lower chest: Small type 1 hiatal hernia.

Hepatobiliary: Contracted gallbladder.  Otherwise unremarkable.

Pancreas: Unremarkable

Spleen: Unremarkable

Adrenals/Urinary Tract: 1.3 by 1.0 cm hypodense lesion in the left
kidney lower pole, internal density 19 Hounsfield units favoring
Bosniak category 1 cyst. No further follow up imaging of this lesion
is warranted.

Separate 0.9 cm exophytic lesion of the left kidney lower pole on
image 81 series 7 and image 66 series 6, internal density 27
Hounsfield units, favoring a Bosniak category 2 cyst. No further
imaging of this lesion is warranted.

The adrenal glands appear normal.  Urinary bladder unremarkable.

Stomach/Bowel: Scarring along the anterior gastric margin and
subcutaneous tissues noted at the site of the prior PEG tube as
shown on images 75 through 77 of series 7, with mild dimpling of the
skin along the subcutaneous scarring. No gas is present along this
region to indicate gastric cutaneous fistula. I do not see an
obvious gastric defect to indicate ulceration or other local
inflammation of the stomach at this site. No abnormal fluid
collection along the rectus abdominus musculature or stomach. No
specific complicating feature or deviation from expected appearance
of prior PEG tube site is observed.

Appendiceal diameter is mildly prominent at 0.8 cm, but the
appendiceal walls do not appear thickened and there is expected gas
and contents within the appendiceal lumen. Accordingly this slightly
prominent appendiceal size is thought to be incidental.

Vascular/Lymphatic: Unremarkable

Reproductive: Unremarkable

Other: No supplemental non-categorized findings.

Musculoskeletal: Borderline left foraminal stenosis at L5-S1 due to
intervertebral and facet spurring along with mild disc bulge. Mild
levoconvex lumbar scoliosis.
IMPRESSION: 1. No specific complicating feature is identified along the prior
PEG tube site. There is expected small band of scarring between the
anterior gastric wall and the rectus abdominus muscle and in the
subcutaneous tissues with slight dimpling of the skin in the prior
region of the PEG tube.
2. Small type 1 hiatal hernia.
3. Borderline left foraminal stenosis at L5-S1. Mild levoconvex
lumbar scoliosis.

## 2023-03-05 ENCOUNTER — Encounter (HOSPITAL_COMMUNITY): Payer: Self-pay

## 2023-03-05 ENCOUNTER — Emergency Department (HOSPITAL_COMMUNITY): Payer: 59

## 2023-03-05 ENCOUNTER — Emergency Department (HOSPITAL_COMMUNITY)
Admission: EM | Admit: 2023-03-05 | Discharge: 2023-03-05 | Disposition: A | Discharge status: Home / Self Care | Payer: 59 | Attending: Emergency Medicine | Admitting: Emergency Medicine

## 2023-03-05 ENCOUNTER — Other Ambulatory Visit: Payer: Self-pay

## 2023-03-05 DIAGNOSIS — F419 Anxiety disorder, unspecified: Secondary | ICD-10-CM | POA: Diagnosis not present

## 2023-03-05 DIAGNOSIS — R531 Weakness: Secondary | ICD-10-CM | POA: Diagnosis not present

## 2023-03-05 DIAGNOSIS — R000 Tachycardia, unspecified: Secondary | ICD-10-CM | POA: Diagnosis not present

## 2023-03-05 DIAGNOSIS — R451 Restlessness and agitation: Secondary | ICD-10-CM | POA: Diagnosis present

## 2023-03-05 DIAGNOSIS — R739 Hyperglycemia, unspecified: Secondary | ICD-10-CM | POA: Diagnosis not present

## 2023-03-05 LAB — COMPREHENSIVE METABOLIC PANEL
ALT: 18 U/L (ref 0–44)
AST: 22 U/L (ref 15–41)
Albumin: 4.5 g/dL (ref 3.5–5.0)
Alkaline Phosphatase: 57 U/L (ref 38–126)
Anion gap: 13 (ref 5–15)
BUN: 15 mg/dL (ref 6–20)
CO2: 22 mmol/L (ref 22–32)
Calcium: 9.2 mg/dL (ref 8.9–10.3)
Chloride: 102 mmol/L (ref 98–111)
Creatinine, Ser: 0.91 mg/dL (ref 0.61–1.24)
GFR, Estimated: >60 mL/min (ref >60)
Glucose, Bld: 46 mg/dL — ABNORMAL LOW (ref 70–99)
Potassium: 3.1 mmol/L — ABNORMAL LOW (ref 3.5–5.1)
Sodium: 137 mmol/L (ref 135–145)
Total Bilirubin: 0.7 mg/dL (ref 0.3–1.2)
Total Protein: 7.7 g/dL (ref 6.5–8.1)

## 2023-03-05 LAB — URINALYSIS, ROUTINE W REFLEX MICROSCOPIC
Bilirubin Urine: NEGATIVE
Glucose, UA: NEGATIVE mg/dL
Hgb urine dipstick: NEGATIVE
Ketones, ur: NEGATIVE mg/dL
Leukocytes,Ua: NEGATIVE
Nitrite: NEGATIVE
Protein, ur: NEGATIVE mg/dL
Specific Gravity, Urine: 1.003 — ABNORMAL LOW (ref 1.005–1.030)
pH: 5 (ref 5.0–8.0)

## 2023-03-05 LAB — RAPID URINE DRUG SCREEN, HOSP PERFORMED
Amphetamines: NOT DETECTED
Barbiturates: NOT DETECTED
Benzodiazepines: NOT DETECTED
Cocaine: NOT DETECTED
Opiates: NOT DETECTED
Tetrahydrocannabinol: NOT DETECTED

## 2023-03-05 LAB — CBC
HCT: 40.2 % (ref 39.0–52.0)
Hemoglobin: 13.6 g/dL (ref 13.0–17.0)
MCH: 31.5 pg (ref 26.0–34.0)
MCHC: 33.8 g/dL (ref 30.0–36.0)
MCV: 93.1 fL (ref 80.0–100.0)
Platelets: 367 10*3/uL (ref 150–400)
RBC: 4.32 MIL/uL (ref 4.22–5.81)
RDW: 13.8 % (ref 11.5–15.5)
WBC: 10.4 10*3/uL (ref 4.0–10.5)
nRBC: 0 % (ref 0.0–0.2)

## 2023-03-05 LAB — CBG MONITORING, ED
Glucose-Capillary: 87 mg/dL (ref 70–99)
Glucose-Capillary: 105 mg/dL — ABNORMAL HIGH (ref 70–99)

## 2023-03-05 LAB — ETHANOL: Alcohol, Ethyl (B): 63 mg/dL — ABNORMAL HIGH (ref <10)

## 2023-03-05 MED ORDER — DEXTROSE 50 % IV SOLN
50.0000 mL | Freq: Once | INTRAVENOUS | Status: DC
  Filled 2023-03-05: qty 50

## 2023-03-05 MED ORDER — POTASSIUM CHLORIDE CRYS ER 20 MEQ PO TBCR
40.0000 meq | EXTENDED_RELEASE_TABLET | Freq: Once | ORAL | Status: AC
  Administered 2023-03-05: 40 meq via ORAL
  Filled 2023-03-05: qty 2

## 2023-03-05 MED ORDER — ZIPRASIDONE MESYLATE 20 MG IM SOLR
10.0000 mg | Freq: Once | INTRAMUSCULAR | Status: AC
  Administered 2023-03-05: 10 mg via INTRAMUSCULAR
  Filled 2023-03-05: qty 20

## 2023-03-05 MED ORDER — SODIUM CHLORIDE 0.9 % IV BOLUS
1000.0000 mL | Freq: Once | INTRAVENOUS | Status: AC
  Administered 2023-03-05: 1000 mL via INTRAVENOUS

## 2023-03-05 MED ORDER — STERILE WATER FOR INJECTION IJ SOLN
INTRAMUSCULAR | Status: AC
  Administered 2023-03-05: 2 mL
  Filled 2023-03-05: qty 10

## 2023-03-05 MED ORDER — RISPERIDONE 0.5 MG PO TABS: 0.5000 mg | ORAL_TABLET | Freq: Every day | ORAL | 1 refills | Status: DC

## 2023-03-05 NOTE — ED Provider Notes (Signed)
[facility] Provider Note   CSN: [id number] Arrival date & time: 03/05/23  1818     History  Chief Complaint  Patient presents with   Psychiatric Evaluation    Blake Hardin is a 52 y.o. male.  Patient has a history of bipolar and schizoaffective disorder.  He got out of jail today and was dropped off in the emergency department by the police.  Initially patient was very anxious  The history is provided by the patient and medical records. No language interpreter was used.  Altered Mental Status Presenting symptoms: behavior changes   Severity:  Moderate Most recent episode:  Today Episode history:  Multiple Timing:  Intermittent Progression:  Waxing and waning Chronicity:  Recurrent Context: alcohol use   Associated symptoms: agitation   Associated symptoms: no abdominal pain, no hallucinations, no headaches, no rash and no seizures        Home Medications Prior to Admission medications   Medication Sig Start Date End Date Taking? Authorizing Provider  gabapentin (NEURONTIN) 300 MG capsule Take 300 mg by mouth 3 (three) times daily. 11/05/22  Yes [provider]  risperiDONE (RISPERDAL) 0.5 MG tablet Take 1 tablet (0.5 mg total) by mouth at bedtime. 03/05/23  Yes Milton Ferguson, MD  Ascorbic Acid (VITAMIN C) 1000 MG tablet Take 1,250 mg by mouth daily. Take with 250 mg gummy Patient not taking: Reported on 03/05/2023    [provider]  buprenorphine (SUBUTEX) 8 MG SUBL SL tablet Place 8 mg under the tongue in the morning, at noon, and at bedtime. Patient not taking: Reported on 03/05/2023 03/29/22   [provider]  CAPLYTA 42 MG capsule Take 42 mg by mouth daily. Patient not taking: Reported on 03/05/2023 04/12/22   [provider]  diazepam (VALIUM) 5 MG tablet 1/2 tab bid for 2 week then 1/2 tab poqday for 2 weeks then stop for anxiety Patient not taking: Reported on 03/05/2023 11/12/17    Susy Frizzle, MD  [facility] SUSTENNA 234 MG/1.5ML injection Inject into the muscle. Patient not taking: Reported on 03/05/2023 08/18/22   [provider]  QUEtiapine (SEROQUEL) 100 MG tablet Take 100 mg by mouth at bedtime. Take 1 to 1 & 1/2 at bedtime. Patient not taking: Reported on 03/05/2023 08/18/22   [provider]      Allergies    Coconut fatty acids, Onion, and Penicillins    Review of Systems   Review of Systems  Constitutional:  Negative for appetite change and fatigue.  HENT:  Negative for congestion, ear discharge and sinus pressure.   Eyes:  Negative for discharge.  Respiratory:  Negative for cough.   Cardiovascular:  Negative for chest pain.  Gastrointestinal:  Negative for abdominal pain and diarrhea.  Genitourinary:  Negative for frequency and hematuria.  Musculoskeletal:  Negative for back pain.  Skin:  Negative for rash.  Neurological:  Negative for seizures and headaches.  Psychiatric/Behavioral:  Positive for agitation. Negative for hallucinations.     Physical Exam Updated Vital Signs BP 92/78 (BP Location: Left Arm)   Pulse (!) 105   Temp 98.4 F (36.9 C) (Oral)   Resp 18   Ht 5\' 11"  (1.803 m)   Wt 59 kg   SpO2 99%   BMI 18.13 kg/m  Physical Exam Vitals and nursing note reviewed.  Constitutional:      Appearance: He is well-developed.  HENT:     Head: Normocephalic.  Nose: Nose normal.  Eyes:     General: No scleral icterus.    Conjunctiva/sclera: Conjunctivae normal.  Neck:     Thyroid: No thyromegaly.  Cardiovascular:     Rate and Rhythm: Normal rate and regular rhythm.     Heart sounds: No murmur heard.    No friction rub. No gallop.  Pulmonary:     Breath sounds: No stridor. No wheezing or rales.  Chest:     Chest wall: No tenderness.  Abdominal:     General: There is no distension.     Tenderness: There is no abdominal tenderness. There is no rebound.  Musculoskeletal:        General: Normal range of motion.      Cervical back: Neck supple.  Lymphadenopathy:     Cervical: No cervical adenopathy.  Skin:    Findings: No erythema or rash.  Neurological:     Mental Status: He is alert and oriented to person, place, and time.     Motor: No abnormal muscle tone.     Coordination: Coordination normal.  Psychiatric:     Comments: Patient very anxious but not suicidal or homicidal     ED Results / Procedures / Treatments   Labs (all labs ordered are listed, but only abnormal results are displayed) Labs Reviewed  COMPREHENSIVE METABOLIC PANEL - Abnormal; Notable for the following components:      Result Value   Potassium 3.1 (*)    Glucose, Bld 46 (*)    All other components within normal limits  ETHANOL - Abnormal; Notable for the following components:   Alcohol, Ethyl (B) 63 (*)    All other components within normal limits  URINALYSIS, ROUTINE W REFLEX MICROSCOPIC - Abnormal; Notable for the following components:   Color, Urine STRAW (*)    Specific Gravity, Urine 1.003 (*)    All other components within normal limits  CBG MONITORING, ED - Abnormal; Notable for the following components:   Glucose-Capillary 105 (*)    All other components within normal limits  CBC  RAPID URINE DRUG SCREEN, HOSP PERFORMED  CBG MONITORING, ED    EKG EKG Interpretation  Date/Time:  Thursday March 05 2023 18:46:14 EDT Ventricular Rate:  129 PR Interval:  148 QRS Duration: 76 QT Interval:  302 QTC Calculation: 442 R Axis:   92 Text Interpretation: Sinus tachycardia Rightward axis Borderline ECG When compared with ECG of 06-Jul-2008 15:11, No significant change was found Confirmed by Milton Ferguson [phone]) on 03/05/2023 9:48:30 PM  Radiology DG Chest Port 1 View  Result Date: 03/05/2023 CLINICAL DATA:  Altered mental status and weakness, initial encounter EXAM: PORTABLE CHEST 1 VIEW COMPARISON:  None Available. FINDINGS: The heart size and mediastinal contours are within normal limits. Both lungs are  clear. The visualized skeletal structures are unremarkable. IMPRESSION: No active disease. Electronically Signed   By: Inez Catalina M.D.   On: 03/05/2023 19:35    Procedures Procedures    Medications Ordered in ED Medications  dextrose 50 % solution 50 mL (0 mLs Intravenous Hold 03/05/23 2200)  ziprasidone (GEODON) injection 10 mg (10 mg Intramuscular Given 03/05/23 1933)  sodium chloride 0.9 % bolus 1,000 mL (0 mLs Intravenous Stopped 03/05/23 2100)  sterile water (preservative free) injection (2 mLs  Given 03/05/23 1933)  potassium chloride SA (KLOR-CON M) CR tablet 40 mEq (40 mEq Oral Given 03/05/23 2214)    ED Course/ Medical Decision Making/ A&P  Patient is not suicidal not homicidal and not hallucinating  now.                             Medical Decision Making Amount and/or Complexity of Data Reviewed Labs: ordered. Radiology: ordered. ECG/medicine tests: ordered.  Risk Prescription drug management.   This patient presents to the ED for concern of anxiety, this involves an extensive number of treatment options, and is a complaint that carries with it a high risk of complications and morbidity.  The differential diagnosis includes situational anxiety or exacerbation of his schizoaffective disorder Co morbidities that complicate the patient evaluation  Schizoaffective disorder   Additional history obtained:  Additional history obtained from patient External records from outside source obtained and reviewed including hospital records   Lab Tests:  I Ordered, and personally interpreted labs.  The pertinent results include: Glucose 46 and alcohol 63   Imaging Studies ordered:  No imaging  Cardiac Monitoring: / EKG:  The patient was maintained on a cardiac monitor.  I personally viewed and interpreted the cardiac monitored which showed an underlying rhythm of: Normal sinus rhythm   Consultations Obtained:  No consultant  Problem List / ED Course / Critical  interventions / Medication management  Schizoaffective disorder and anxiety I ordered medication including Geodon for anxiety Reevaluation of the patient after these medicines showed that the patient improved I have reviewed the patients home medicines and have made adjustments as needed   Social Determinants of Health:  Schizoaffective disorder   Test / Admission - Considered:  None   Patient with a history of bipolar and schizoaffective disorder.  He also has significant anxiety.  Patient improved with Geodon.  He has not suicidal homicidal or hallucinating.  We will place him back on his Risperdal and he will follow-up with behavioral health or [facility].  Patient also had some hyperglycemia that has responded to him eating pizza        Final Clinical Impression(s) / ED Diagnoses Final diagnoses:  Anxiety    Rx / DC Orders ED Discharge Orders          Ordered    risperiDONE (RISPERDAL) 0.5 MG tablet  Daily at bedtime        03/05/23 2241              Milton Ferguson, MD 03/07/23 1105

## 2023-03-05 NOTE — ED Notes (Signed)
Pt laying in bed being cooperative.  Eating pizza and sprite.  Very nice at present.  Appears to be calming down somewhat

## 2023-03-05 NOTE — Discharge Instructions (Signed)
Follow-up with your health in [place] or [place].  Get your prescription filled tomorrow.

## 2023-03-05 NOTE — ED Notes (Signed)
Pt  very hyper.manic.  cooperative. Singing outloud

## 2023-03-05 NOTE — ED Triage Notes (Addendum)
Pt dropped off by [facility] and no report was received. Pt pacing around and speaking with loud pressured speech. Pt with flight of ideas. Stating "I was in jail and they wouldn't let met walk around. Do you want my pizza?" When asked why he was in the ED today pt states "I don't know you tell me, I want to add on a trauma unit to the hospital." Pt started doing push ups in the floor and started singing. Pt reports ETOH today. Denies substance use.

## 2023-03-05 NOTE — ED Notes (Signed)
Pt up to [facility] and voided.   Spec sent to lab

## 2023-03-05 NOTE — ED Notes (Signed)
Primary nurse is heating up pizza to give pt

## 2023-03-06 DIAGNOSIS — F17210 Nicotine dependence, cigarettes, uncomplicated: Secondary | ICD-10-CM | POA: Diagnosis not present

## 2023-03-06 DIAGNOSIS — Z880 Allergy status to penicillin: Secondary | ICD-10-CM | POA: Diagnosis not present

## 2023-03-06 DIAGNOSIS — I499 Cardiac arrhythmia, unspecified: Secondary | ICD-10-CM | POA: Diagnosis not present

## 2023-03-06 DIAGNOSIS — G894 Chronic pain syndrome: Secondary | ICD-10-CM | POA: Diagnosis not present

## 2023-03-06 DIAGNOSIS — Z5321 Procedure and treatment not carried out due to patient leaving prior to being seen by health care provider: Secondary | ICD-10-CM | POA: Diagnosis not present

## 2023-03-06 DIAGNOSIS — Z743 Need for continuous supervision: Secondary | ICD-10-CM | POA: Diagnosis not present

## 2023-03-06 DIAGNOSIS — Z760 Encounter for issue of repeat prescription: Secondary | ICD-10-CM | POA: Diagnosis not present

## 2023-03-08 DIAGNOSIS — F17210 Nicotine dependence, cigarettes, uncomplicated: Secondary | ICD-10-CM | POA: Diagnosis not present

## 2023-03-08 DIAGNOSIS — R509 Fever, unspecified: Secondary | ICD-10-CM | POA: Diagnosis not present

## 2023-03-08 DIAGNOSIS — Z743 Need for continuous supervision: Secondary | ICD-10-CM | POA: Diagnosis not present

## 2023-03-08 DIAGNOSIS — Z880 Allergy status to penicillin: Secondary | ICD-10-CM | POA: Diagnosis not present

## 2023-03-08 DIAGNOSIS — M545 Low back pain, unspecified: Secondary | ICD-10-CM | POA: Diagnosis not present

## 2023-03-08 DIAGNOSIS — M47817 Spondylosis without myelopathy or radiculopathy, lumbosacral region: Secondary | ICD-10-CM | POA: Diagnosis not present

## 2023-03-08 DIAGNOSIS — G8929 Other chronic pain: Secondary | ICD-10-CM | POA: Diagnosis not present

## 2023-03-08 DIAGNOSIS — B349 Viral infection, unspecified: Secondary | ICD-10-CM | POA: Diagnosis not present

## 2023-03-08 DIAGNOSIS — Z1152 Encounter for screening for COVID-19: Secondary | ICD-10-CM | POA: Diagnosis not present

## 2023-03-08 DIAGNOSIS — R6889 Other general symptoms and signs: Secondary | ICD-10-CM | POA: Diagnosis not present

## 2023-03-08 DIAGNOSIS — I499 Cardiac arrhythmia, unspecified: Secondary | ICD-10-CM | POA: Diagnosis not present

## 2023-03-08 DIAGNOSIS — M549 Dorsalgia, unspecified: Secondary | ICD-10-CM | POA: Diagnosis not present

## 2023-03-09 DIAGNOSIS — L03119 Cellulitis of unspecified part of limb: Secondary | ICD-10-CM | POA: Diagnosis not present

## 2023-03-09 DIAGNOSIS — Z880 Allergy status to penicillin: Secondary | ICD-10-CM | POA: Diagnosis not present

## 2023-03-09 DIAGNOSIS — F17210 Nicotine dependence, cigarettes, uncomplicated: Secondary | ICD-10-CM | POA: Diagnosis not present

## 2023-03-09 DIAGNOSIS — Z91018 Allergy to other foods: Secondary | ICD-10-CM | POA: Diagnosis not present

## 2023-03-09 DIAGNOSIS — L03116 Cellulitis of left lower limb: Secondary | ICD-10-CM | POA: Diagnosis not present

## 2023-03-09 DIAGNOSIS — L03115 Cellulitis of right lower limb: Secondary | ICD-10-CM | POA: Diagnosis not present

## 2023-03-09 DIAGNOSIS — Z79899 Other long term (current) drug therapy: Secondary | ICD-10-CM | POA: Diagnosis not present

## 2023-03-09 DIAGNOSIS — M7989 Other specified soft tissue disorders: Secondary | ICD-10-CM | POA: Diagnosis not present

## 2023-03-09 DIAGNOSIS — R5381 Other malaise: Secondary | ICD-10-CM | POA: Diagnosis not present

## 2023-03-09 DIAGNOSIS — R000 Tachycardia, unspecified: Secondary | ICD-10-CM | POA: Diagnosis not present

## 2023-05-10 NOTE — Progress Notes (Signed)
This is standard for psychiatric patient

## 2023-05-20 ENCOUNTER — Emergency Department (HOSPITAL_COMMUNITY)
Admission: EM | Admit: 2023-05-20 | Discharge: 2023-05-20 | Disposition: A | Discharge status: Home / Self Care | Payer: 59 | Attending: Emergency Medicine | Admitting: Emergency Medicine

## 2023-05-20 ENCOUNTER — Emergency Department (HOSPITAL_COMMUNITY): Admission: EM | Admit: 2023-05-20 | Discharge: 2023-05-21 | Discharge status: Against Medical Advice | Payer: 59

## 2023-05-20 ENCOUNTER — Other Ambulatory Visit: Payer: Self-pay

## 2023-05-20 ENCOUNTER — Encounter (HOSPITAL_COMMUNITY): Payer: Self-pay

## 2023-05-20 DIAGNOSIS — F250 Schizoaffective disorder, bipolar type: Secondary | ICD-10-CM | POA: Insufficient documentation

## 2023-05-20 DIAGNOSIS — R000 Tachycardia, unspecified: Secondary | ICD-10-CM | POA: Insufficient documentation

## 2023-05-20 DIAGNOSIS — Z760 Encounter for issue of repeat prescription: Secondary | ICD-10-CM | POA: Insufficient documentation

## 2023-05-20 MED ORDER — RISPERIDONE MICROSPHERES ER 25 MG IM SRER: 25.0000 mg | Freq: Once | INTRAMUSCULAR | Status: DC

## 2023-05-20 MED ORDER — RISPERIDONE 1 MG PO TBDP: 1.0000 mg | ORAL_TABLET | Freq: Once | ORAL | Status: DC

## 2023-05-20 MED ORDER — RISPERIDONE 0.5 MG PO TABS: 0.5000 mg | ORAL_TABLET | Freq: Every day | ORAL | 1 refills | Status: DC

## 2023-05-20 NOTE — Discharge Instructions (Addendum)
I have attached a resource guide, as far as I can tell with [facility] you just need to either schedule an appointment or return during daytime hours, I have refilled your risperidone to [facility].  If you would like to discuss additional facial reconstructive surgery you need to contact your plastic surgeons at [facility]

## 2023-05-20 NOTE — ED Triage Notes (Signed)
Pt reports he just got out of jail and needs an rx for his bipolar meds,  He would also like help scheduling surgery for his face.
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
## 2023-05-21 DIAGNOSIS — Z760 Encounter for issue of repeat prescription: Secondary | ICD-10-CM | POA: Diagnosis not present

## 2023-05-21 DIAGNOSIS — Z880 Allergy status to penicillin: Secondary | ICD-10-CM | POA: Diagnosis not present

## 2023-05-21 DIAGNOSIS — F17210 Nicotine dependence, cigarettes, uncomplicated: Secondary | ICD-10-CM | POA: Diagnosis not present

## 2023-05-21 NOTE — ED Notes (Signed)
Called for pt x3, pt not in lobby. Security went outside and did not see pt.

## 2023-06-04 ENCOUNTER — Telehealth: Payer: Self-pay | Admitting: *Deleted

## 2023-06-04 NOTE — Progress Notes (Signed)
  Care Coordination  Outreach Note  06/04/2023 Name: Blake Hardin MRN: [id number] DOB: [birth year]   Care Coordination Outreach Attempts: An unsuccessful telephone outreach was attempted today to offer the patient information about available care coordination services.  Follow Up Plan:  No further outreach attempts will be made at this time. We have been unable to contact the patient to offer or enroll patient in care coordination services. Made several outreach attempt from multiple contact numbers and patient is unable to be located by phone.   Encounter Outcome:  No Answer   Gwenevere Ghazi  Care Coordination Care Guide  Direct Dial: [phone]

## 2023-07-10 ENCOUNTER — Other Ambulatory Visit: Payer: Self-pay

## 2023-07-10 ENCOUNTER — Ambulatory Visit (HOSPITAL_COMMUNITY)
Admission: EM | Admit: 2023-07-10 | Discharge: 2023-07-10 | Disposition: A | Discharge status: Home / Self Care | Payer: 59 | Source: Home / Self Care

## 2023-07-10 ENCOUNTER — Emergency Department (HOSPITAL_COMMUNITY)
Admission: EM | Admit: 2023-07-10 | Discharge: 2023-07-10 | Discharge status: Against Medical Advice | Payer: 59 | Source: Home / Self Care

## 2023-07-10 DIAGNOSIS — Z5321 Procedure and treatment not carried out due to patient leaving prior to being seen by health care provider: Secondary | ICD-10-CM | POA: Insufficient documentation

## 2023-07-10 DIAGNOSIS — M7918 Myalgia, other site: Secondary | ICD-10-CM | POA: Insufficient documentation

## 2023-07-10 DIAGNOSIS — M545 Low back pain, unspecified: Secondary | ICD-10-CM | POA: Diagnosis not present

## 2023-07-10 DIAGNOSIS — G8929 Other chronic pain: Secondary | ICD-10-CM | POA: Diagnosis not present

## 2023-07-10 NOTE — ED Notes (Signed)
Patient seen leaving the ER , and notified staff that he is leaving .

## 2023-07-10 NOTE — BH Assessment (Addendum)
Pt is refusing to speak to TTS until he is able to read the Bible. Pt reports he does not want to stay or be treated by a facility that does not have a Bible. Pt became combative and requested to leave. Pt left AMA and refused to sign waiver.

## 2023-07-10 NOTE — ED Notes (Signed)
Patient initially stated he wanted help with cocaine and was placed in assessment room after being brought in by [facility].  Once in assessment he refused to speak with TTS and was demanding a bible.  Patient was informed staff would try and find one but patient would not accept this and he demanded to leave.  Security called and patient was escorted to front lobby.  He left the building after refusing treatment.

## 2023-07-10 NOTE — ED Triage Notes (Signed)
Patient reports chronic low back pain for several years denies recent injury or fall .

## 2023-07-11 ENCOUNTER — Encounter (HOSPITAL_COMMUNITY): Payer: Self-pay | Admitting: Emergency Medicine

## 2023-07-11 ENCOUNTER — Emergency Department (HOSPITAL_COMMUNITY)
Admission: EM | Admit: 2023-07-11 | Discharge: 2023-07-11 | Discharge status: Against Medical Advice | Payer: 59 | Source: Home / Self Care

## 2023-07-11 ENCOUNTER — Other Ambulatory Visit: Payer: Self-pay

## 2023-07-11 NOTE — ED Triage Notes (Signed)
Patient coming to ED for evaluation of generalized body aches.  Reports "everything hurts.  My feet hurt from walking over 300  miles.  I haven't had a shower."  No reports of fever

## 2023-08-04 ENCOUNTER — Emergency Department (HOSPITAL_COMMUNITY)
Admission: EM | Admit: 2023-08-04 | Discharge: 2023-08-05 | Discharge status: Against Medical Advice | Payer: 59 | Attending: Emergency Medicine | Admitting: Emergency Medicine

## 2023-08-04 DIAGNOSIS — Z5321 Procedure and treatment not carried out due to patient leaving prior to being seen by health care provider: Secondary | ICD-10-CM | POA: Diagnosis not present

## 2023-08-04 DIAGNOSIS — F17210 Nicotine dependence, cigarettes, uncomplicated: Secondary | ICD-10-CM | POA: Insufficient documentation

## 2023-08-04 DIAGNOSIS — R461 Bizarre personal appearance: Secondary | ICD-10-CM | POA: Insufficient documentation

## 2023-08-04 DIAGNOSIS — I499 Cardiac arrhythmia, unspecified: Secondary | ICD-10-CM | POA: Diagnosis not present

## 2023-08-04 DIAGNOSIS — Z743 Need for continuous supervision: Secondary | ICD-10-CM | POA: Diagnosis not present
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
## 2024-01-26 ENCOUNTER — Other Ambulatory Visit: Payer: Self-pay

## 2024-01-26 ENCOUNTER — Emergency Department (HOSPITAL_COMMUNITY)
Admission: EM | Admit: 2024-01-26 | Discharge: 2024-01-26 | Disposition: A | Discharge status: Home / Self Care | Payer: Medicare Other | Attending: Student | Admitting: Student

## 2024-01-26 DIAGNOSIS — F17210 Nicotine dependence, cigarettes, uncomplicated: Secondary | ICD-10-CM | POA: Diagnosis not present

## 2024-01-26 DIAGNOSIS — Z760 Encounter for issue of repeat prescription: Secondary | ICD-10-CM | POA: Diagnosis not present

## 2024-01-26 MED ORDER — RISPERIDONE 0.5 MG PO TABS: 0.5000 mg | ORAL_TABLET | Freq: Every day | ORAL | 1 refills | Status: AC

## 2024-01-26 MED ORDER — QUETIAPINE FUMARATE 100 MG PO TABS: 100.0000 mg | ORAL_TABLET | Freq: Every day | ORAL | 0 refills | Status: AC

## 2024-01-26 NOTE — ED Triage Notes (Signed)
 Pt bib [facility], pt has no no complaints at this time. Pt is homeless and wants to be sent to [facility].
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
## 2024-01-26 NOTE — ED Notes (Signed)
 Pt refused vitals

## 2024-01-29 DIAGNOSIS — Z139 Encounter for screening, unspecified: Secondary | ICD-10-CM | POA: Diagnosis not present

## 2024-01-29 DIAGNOSIS — Z79899 Other long term (current) drug therapy: Secondary | ICD-10-CM | POA: Diagnosis not present

## 2024-01-29 DIAGNOSIS — Z880 Allergy status to penicillin: Secondary | ICD-10-CM | POA: Diagnosis not present

## 2024-01-29 DIAGNOSIS — F17210 Nicotine dependence, cigarettes, uncomplicated: Secondary | ICD-10-CM | POA: Diagnosis not present

## 2024-01-29 DIAGNOSIS — Z760 Encounter for issue of repeat prescription: Secondary | ICD-10-CM | POA: Diagnosis not present

## 2024-03-14 DIAGNOSIS — R404 Transient alteration of awareness: Secondary | ICD-10-CM | POA: Diagnosis not present

## 2024-03-14 DIAGNOSIS — I499 Cardiac arrhythmia, unspecified: Secondary | ICD-10-CM | POA: Diagnosis not present

## 2024-03-14 DIAGNOSIS — Z743 Need for continuous supervision: Secondary | ICD-10-CM | POA: Diagnosis not present

## 2024-03-15 ENCOUNTER — Encounter (HOSPITAL_COMMUNITY): Payer: Self-pay | Admitting: Emergency Medicine

## 2024-03-15 ENCOUNTER — Other Ambulatory Visit: Payer: Self-pay

## 2024-03-15 ENCOUNTER — Emergency Department (HOSPITAL_COMMUNITY)
Admission: EM | Admit: 2024-03-15 | Discharge: 2024-03-15 | Disposition: A | Discharge status: Home / Self Care | Attending: Emergency Medicine | Admitting: Emergency Medicine

## 2024-03-15 ENCOUNTER — Emergency Department (HOSPITAL_COMMUNITY)
Admission: EM | Admit: 2024-03-15 | Discharge: 2024-03-15 | Discharge status: Against Medical Advice | Attending: Emergency Medicine | Admitting: Emergency Medicine

## 2024-03-15 DIAGNOSIS — S0993XD Unspecified injury of face, subsequent encounter: Secondary | ICD-10-CM | POA: Diagnosis not present

## 2024-03-15 DIAGNOSIS — R079 Chest pain, unspecified: Secondary | ICD-10-CM | POA: Insufficient documentation

## 2024-03-15 DIAGNOSIS — M952 Other acquired deformity of head: Secondary | ICD-10-CM | POA: Diagnosis not present

## 2024-03-15 DIAGNOSIS — S0993XS Unspecified injury of face, sequela: Secondary | ICD-10-CM

## 2024-03-15 DIAGNOSIS — Z5321 Procedure and treatment not carried out due to patient leaving prior to being seen by health care provider: Secondary | ICD-10-CM | POA: Insufficient documentation
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
## 2024-03-15 NOTE — ED Triage Notes (Signed)
 Pt bib EMS for c/o chest pain that he rates as -1. Pt agitated during triage because he wants some water.
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
## 2024-03-15 NOTE — ED Triage Notes (Signed)
 Pt states he got out of jail today after 4 years and wants to commit himself to get his face fixed.

## 2024-04-08 DIAGNOSIS — M79672 Pain in left foot: Secondary | ICD-10-CM | POA: Diagnosis not present

## 2024-04-08 DIAGNOSIS — F17210 Nicotine dependence, cigarettes, uncomplicated: Secondary | ICD-10-CM | POA: Diagnosis not present

## 2024-04-08 DIAGNOSIS — M79671 Pain in right foot: Secondary | ICD-10-CM | POA: Diagnosis not present

## 2024-04-09 DIAGNOSIS — M545 Low back pain, unspecified: Secondary | ICD-10-CM | POA: Diagnosis not present

## 2024-04-09 DIAGNOSIS — M542 Cervicalgia: Secondary | ICD-10-CM | POA: Diagnosis not present

## 2024-04-09 DIAGNOSIS — S161XXA Strain of muscle, fascia and tendon at neck level, initial encounter: Secondary | ICD-10-CM | POA: Diagnosis not present

## 2024-04-09 DIAGNOSIS — Z556 Problems related to health literacy: Secondary | ICD-10-CM | POA: Diagnosis not present

## 2024-04-09 DIAGNOSIS — F17210 Nicotine dependence, cigarettes, uncomplicated: Secondary | ICD-10-CM | POA: Diagnosis not present

## 2024-04-11 DIAGNOSIS — M545 Low back pain, unspecified: Secondary | ICD-10-CM | POA: Diagnosis not present

## 2024-04-11 DIAGNOSIS — Z720 Tobacco use: Secondary | ICD-10-CM | POA: Diagnosis not present

## 2024-04-11 DIAGNOSIS — G8929 Other chronic pain: Secondary | ICD-10-CM | POA: Diagnosis not present

## 2024-04-11 DIAGNOSIS — Z765 Malingerer [conscious simulation]: Secondary | ICD-10-CM | POA: Diagnosis not present

## 2024-04-11 DIAGNOSIS — Z556 Problems related to health literacy: Secondary | ICD-10-CM | POA: Diagnosis not present

## 2024-04-12 DIAGNOSIS — Z87828 Personal history of other (healed) physical injury and trauma: Secondary | ICD-10-CM | POA: Diagnosis not present

## 2024-04-12 DIAGNOSIS — H1033 Unspecified acute conjunctivitis, bilateral: Secondary | ICD-10-CM | POA: Diagnosis not present

## 2024-04-12 DIAGNOSIS — Z91148 Patient's other noncompliance with medication regimen for other reason: Secondary | ICD-10-CM | POA: Diagnosis not present

## 2024-04-12 DIAGNOSIS — Z653 Problems related to other legal circumstances: Secondary | ICD-10-CM | POA: Diagnosis not present

## 2024-04-12 DIAGNOSIS — Z87891 Personal history of nicotine dependence: Secondary | ICD-10-CM | POA: Diagnosis not present

## 2024-04-12 DIAGNOSIS — H1089 Other conjunctivitis: Secondary | ICD-10-CM | POA: Diagnosis not present

## 2024-04-12 DIAGNOSIS — R451 Restlessness and agitation: Secondary | ICD-10-CM | POA: Diagnosis not present

## 2024-04-12 DIAGNOSIS — L559 Sunburn, unspecified: Secondary | ICD-10-CM | POA: Diagnosis not present

## 2024-04-13 DIAGNOSIS — H1033 Unspecified acute conjunctivitis, bilateral: Secondary | ICD-10-CM | POA: Diagnosis not present

## 2024-04-14 DIAGNOSIS — Z760 Encounter for issue of repeat prescription: Secondary | ICD-10-CM | POA: Diagnosis not present

## 2024-04-24 DIAGNOSIS — F17210 Nicotine dependence, cigarettes, uncomplicated: Secondary | ICD-10-CM | POA: Diagnosis not present

## 2024-04-24 DIAGNOSIS — Z765 Malingerer [conscious simulation]: Secondary | ICD-10-CM | POA: Diagnosis not present

## 2024-04-24 DIAGNOSIS — R0689 Other abnormalities of breathing: Secondary | ICD-10-CM | POA: Diagnosis not present

## 2024-04-24 DIAGNOSIS — M549 Dorsalgia, unspecified: Secondary | ICD-10-CM | POA: Diagnosis not present

## 2024-04-24 DIAGNOSIS — Z0489 Encounter for examination and observation for other specified reasons: Secondary | ICD-10-CM | POA: Diagnosis not present

## 2024-04-24 DIAGNOSIS — Z760 Encounter for issue of repeat prescription: Secondary | ICD-10-CM | POA: Diagnosis not present

## 2024-04-24 DIAGNOSIS — Z7289 Other problems related to lifestyle: Secondary | ICD-10-CM | POA: Diagnosis not present

## 2024-04-24 DIAGNOSIS — Z0000 Encounter for general adult medical examination without abnormal findings: Secondary | ICD-10-CM | POA: Diagnosis not present

## 2024-04-24 DIAGNOSIS — Z5900 Homelessness unspecified: Secondary | ICD-10-CM | POA: Diagnosis not present

## 2024-04-24 DIAGNOSIS — Z880 Allergy status to penicillin: Secondary | ICD-10-CM | POA: Diagnosis not present

## 2024-04-24 DIAGNOSIS — I959 Hypotension, unspecified: Secondary | ICD-10-CM | POA: Diagnosis not present

## 2024-04-26 DIAGNOSIS — Z20822 Contact with and (suspected) exposure to covid-19: Secondary | ICD-10-CM | POA: Diagnosis not present

## 2024-05-12 DIAGNOSIS — Z880 Allergy status to penicillin: Secondary | ICD-10-CM | POA: Diagnosis not present

## 2024-05-12 DIAGNOSIS — S61031A Puncture wound without foreign body of right thumb without damage to nail, initial encounter: Secondary | ICD-10-CM | POA: Diagnosis not present

## 2024-05-12 DIAGNOSIS — M545 Low back pain, unspecified: Secondary | ICD-10-CM | POA: Diagnosis not present

## 2024-05-12 DIAGNOSIS — G8929 Other chronic pain: Secondary | ICD-10-CM | POA: Diagnosis not present

## 2024-05-12 DIAGNOSIS — Z79899 Other long term (current) drug therapy: Secondary | ICD-10-CM | POA: Diagnosis not present

## 2024-05-12 DIAGNOSIS — Z91018 Allergy to other foods: Secondary | ICD-10-CM | POA: Diagnosis not present

## 2024-05-12 DIAGNOSIS — F17210 Nicotine dependence, cigarettes, uncomplicated: Secondary | ICD-10-CM | POA: Diagnosis not present

## 2024-12-19 ENCOUNTER — Other Ambulatory Visit: Payer: Self-pay

## 2024-12-19 ENCOUNTER — Encounter (HOSPITAL_COMMUNITY): Payer: Self-pay

## 2024-12-19 ENCOUNTER — Emergency Department (HOSPITAL_COMMUNITY)
Admission: EM | Admit: 2024-12-19 | Discharge: 2024-12-19 | Disposition: A | Discharge status: Home / Self Care | Attending: Emergency Medicine | Admitting: Emergency Medicine

## 2024-12-19 DIAGNOSIS — Z760 Encounter for issue of repeat prescription: Secondary | ICD-10-CM | POA: Insufficient documentation

## 2024-12-19 NOTE — ED Provider Notes (Signed)
 " [place] EMERGENCY DEPARTMENT AT [facility] Provider Note   CSN: [id number] Arrival date & time: 12/19/24  1946     Patient presents with: Drug Problem   Blake Hardin is a 54 y.o. male presents the ER today for medication refills.  Patient reports he wants medication refills on anything I can give him.  Also requesting Subutex  refills.  He reports that he has been in and out of jail for the past 4 years.  Recently released from jail within the past few days.  He reports that he was not on any medications while he was in jail.  He would like to be restarted on his medications however he does not know the dosage or all the names.  Unsure when the last time he saw a primary care provider for these medications.  He reports been quite some time.  He denies any homicidal or suicidal ideations.  Denies any hallucinations.  He would like to be given information for rehab facilities for trauma and mental health.  Drug Problem       Prior to Admission medications  Medication Sig Start Date End Date Taking? Authorizing Provider  Ascorbic Acid (VITAMIN C) 1000 MG tablet Take 1,250 mg by mouth daily. Take with 250 mg gummy Patient not taking: Reported on 03/05/2023    [provider]  buprenorphine  (SUBUTEX ) 8 MG SUBL SL tablet Place 8 mg under the tongue in the morning, at noon, and at bedtime. Patient not taking: Reported on 03/05/2023 03/29/22   [provider]  CAPLYTA  42 MG capsule Take 42 mg by mouth daily. Patient not taking: Reported on 03/05/2023 04/12/22   [provider]  diazepam  (VALIUM ) 5 MG tablet 1/2 tab bid for 2 week then 1/2 tab poqday for 2 weeks then stop for anxiety Patient not taking: Reported on 03/05/2023 11/12/17   Duanne Butler DASEN, MD  gabapentin  (NEURONTIN ) 300 MG capsule Take 300 mg by mouth 3 (three) times daily. 11/05/22   [provider]  INVEGA SUSTENNA 234 MG/1.5ML injection Inject into the muscle. Patient not  taking: Reported on 03/05/2023 08/18/22   [provider]  QUEtiapine  (SEROQUEL ) 100 MG tablet Take 1 tablet (100 mg total) by mouth at bedtime. Take 1 to 1 & 1/2 at bedtime. 01/26/24 02/25/24  Kommor, Madison, MD  risperiDONE  (RISPERDAL ) 0.5 MG tablet Take 1 tablet (0.5 mg total) by mouth at bedtime. 01/26/24   Kommor, Madison, MD    Allergies: Coconut fatty acid, Onion, and Penicillins    Review of Systems  Psychiatric/Behavioral:  Negative for hallucinations, self-injury and suicidal ideas.    See HPI Updated Vital Signs BP (!) 129/91 (BP Location: Right Arm)   Pulse 84   Temp 98 F (36.7 C) (Oral)   Resp 20   SpO2 99%   Physical Exam Vitals and nursing note reviewed.  Constitutional:      General: He is not in acute distress.    Appearance: He is not toxic-appearing.  Eyes:     General: No scleral icterus. Pulmonary:     Effort: Pulmonary effort is normal. No respiratory distress.  Skin:    General: Skin is dry.  Neurological:     Mental Status: He is alert.     Gait: Gait normal.  Psychiatric:        Attention and Perception: He does not perceive auditory or visual hallucinations.        Thought Content: Thought content does not include homicidal or  suicidal ideation. Thought content does not include homicidal or suicidal plan.     Comments: Does not appear to be responding to any internal stimuli.  Answers questions appropriately.     (all labs ordered are listed, but only abnormal results are displayed) Labs Reviewed - No data to display  EKG: None  Radiology: No results found.  Procedures   Medications Ordered in the ED - No data to display  Medical Decision Making  54 y.o. male presents to the ER today for evaluation requesting medication refill. Vital signs mildly elevated blood pressure otherwise unremarkable. Physical exam as noted above.   Patient reports he is in energy over the past 4 years.  Recently released from jail few days prior.  He  is requesting medication refills.  He is requesting Subutex  however reports that he was not on this previously.  Reports that he was not on any medications while he was in jail.  He is unsure of the dosing of his medications.  He reports that he would like rehab however not from substances but for therapy.  I do feel he is having some secondary gain as he reports he is currently homeless and was recently discharged and does not have any family go to.  He reports occasional alcohol use but no daily use.  Reports marijuana use but denies any other drug use.  He does not appear in any acute withdrawal currently.  Nontremulous, skin is dry, mildly elevated blood pressure, otherwise unremarkable.  I do see from previous chart evaluation that patient's been urgent care before asking for Subutex  and other medication refills.  Given that he has not had any establish care, do not feel comfortable prescribing him multiple medications, as well as him not noting his doses.  Will give him follow-up for behavioral health urgent care to get him established with a psych provider and to reestablish him on medications.  He denies any suicidal or homicidal ideations.  Does not appear in any acute distress.  Did not appear to responding to any internal stimuli.  He is agreeable to the plan and can drive himself to the UC.  He is stable for discharge home with outpatient follow-up.  We discussed plan at bedside. We discussed strict return precautions and red flag symptoms. The patient verbalized their understanding and agrees to the plan. The patient is stable and being discharged home in good condition.  I discussed this case with my attending physician who cosigned this note including patient's presenting symptoms, physical exam, and planned diagnostics and interventions. Attending physician stated agreement with plan or made changes to plan which were implemented.   Portions of this report may have been transcribed using voice  recognition software. Every effort was made to ensure accuracy; however, inadvertent computerized transcription errors may be present.     Final diagnoses:  Medication refill    ED Discharge Orders     None          Bernis Ernst, DEVONNA 12/19/24 2158    Darra Fonda MATSU, MD 12/19/24 2254  "

## 2024-12-19 NOTE — ED Triage Notes (Signed)
 Pt wants MH services and put back on his MH meds and wants back on subutex 

## 2024-12-19 NOTE — ED Triage Notes (Signed)
 Pt is looking for long term drug rehab facility. Pt states that he is not suicidal or homicidal.

## 2024-12-19 NOTE — Discharge Instructions (Signed)
 Please go to Behavioral Health Urgent Care to be established on your psych medications. I have included the information into the paperwork. If you have any concerns, new or worsening symptoms, please return to the ER.

## 2024-12-20 ENCOUNTER — Other Ambulatory Visit: Payer: Self-pay

## 2024-12-20 ENCOUNTER — Encounter (HOSPITAL_COMMUNITY): Payer: Self-pay

## 2024-12-20 ENCOUNTER — Emergency Department (HOSPITAL_COMMUNITY)
Admission: EM | Admit: 2024-12-20 | Discharge: 2024-12-21 | Disposition: A | Attending: Emergency Medicine | Admitting: Emergency Medicine

## 2024-12-20 DIAGNOSIS — Z5321 Procedure and treatment not carried out due to patient leaving prior to being seen by health care provider: Secondary | ICD-10-CM | POA: Diagnosis not present

## 2024-12-20 DIAGNOSIS — Z008 Encounter for other general examination: Secondary | ICD-10-CM | POA: Insufficient documentation

## 2024-12-20 NOTE — ED Triage Notes (Addendum)
 Pt states he needs to get back into MH services. Pt states that he wants to talk to someone from psych. Pt does not want to hurt himself or anyone else. Pt wants back on his regular meds.
